# Patient Record
Sex: Female | Born: 1975 | Race: White | Hispanic: No | Marital: Married | Smoking: Never smoker
Health system: Southern US, Community
[De-identification: ages and names within clinical notes are randomized; demographics above are authoritative.]

## PROBLEM LIST (undated history)

## (undated) DIAGNOSIS — L709 Acne, unspecified: Secondary | ICD-10-CM

## (undated) DIAGNOSIS — F329 Major depressive disorder, single episode, unspecified: Secondary | ICD-10-CM

## (undated) DIAGNOSIS — F419 Anxiety disorder, unspecified: Secondary | ICD-10-CM

## (undated) DIAGNOSIS — F32A Depression, unspecified: Secondary | ICD-10-CM

## (undated) HISTORY — DX: Acne, unspecified: L70.9

## (undated) HISTORY — DX: Major depressive disorder, single episode, unspecified: F32.9

## (undated) HISTORY — DX: Depression, unspecified: F32.A

## (undated) HISTORY — DX: Anxiety disorder, unspecified: F41.9

---

## 2007-08-18 ENCOUNTER — Encounter: Payer: Self-pay | Admitting: Family Medicine

## 2007-08-20 ENCOUNTER — Encounter: Payer: Self-pay | Admitting: Family Medicine

## 2007-08-21 ENCOUNTER — Encounter (INDEPENDENT_AMBULATORY_CARE_PROVIDER_SITE_OTHER): Payer: Self-pay | Admitting: *Deleted

## 2007-08-21 LAB — CONVERTED CEMR LAB
ALT: 17 units/L
Alkaline Phosphatase: 53 units/L
BUN: 13 mg/dL
CO2, serum: 27 mmol/L
Cholesterol: 145 mg/dL
HDL: 35 mg/dL
LDL Cholesterol: 98 mg/dL
MCH: 29 pg
Platelets: 232 10*3/uL
RBC count: 4.6 10*6/uL
Total Bilirubin: 0.5 mg/dL
Total Protein: 6.8 g/dL
Triglycerides: 62 mg/dL

## 2008-07-16 ENCOUNTER — Ambulatory Visit: Payer: Self-pay | Admitting: Family Medicine

## 2008-08-26 ENCOUNTER — Encounter (INDEPENDENT_AMBULATORY_CARE_PROVIDER_SITE_OTHER): Payer: Self-pay | Admitting: *Deleted

## 2009-04-22 ENCOUNTER — Ambulatory Visit: Payer: Self-pay | Admitting: Internal Medicine

## 2009-04-22 ENCOUNTER — Ambulatory Visit: Payer: Self-pay | Admitting: Diagnostic Radiology

## 2009-04-22 ENCOUNTER — Ambulatory Visit (HOSPITAL_BASED_OUTPATIENT_CLINIC_OR_DEPARTMENT_OTHER): Admission: RE | Admit: 2009-04-22 | Discharge: 2009-04-22 | Payer: Self-pay | Admitting: Internal Medicine

## 2009-04-22 DIAGNOSIS — R091 Pleurisy: Secondary | ICD-10-CM | POA: Insufficient documentation

## 2009-09-05 ENCOUNTER — Other Ambulatory Visit: Admission: RE | Admit: 2009-09-05 | Discharge: 2009-09-05 | Payer: Self-pay | Admitting: Family Medicine

## 2009-09-05 ENCOUNTER — Ambulatory Visit: Payer: Self-pay | Admitting: Family Medicine

## 2009-09-05 DIAGNOSIS — T07XXXA Unspecified multiple injuries, initial encounter: Secondary | ICD-10-CM | POA: Insufficient documentation

## 2009-09-05 LAB — CONVERTED CEMR LAB
ALT: 13 units/L (ref 0–35)
AST: 17 units/L (ref 0–37)
Albumin: 4.3 g/dL (ref 3.5–5.2)
Alkaline Phosphatase: 48 units/L (ref 39–117)
Bilirubin, Direct: 0.1 mg/dL (ref 0.0–0.3)
Chloride: 103 meq/L (ref 96–112)
Eosinophils Absolute: 0.1 10*3/uL (ref 0.0–0.7)
Eosinophils Relative: 1.4 % (ref 0.0–5.0)
GFR calc non Af Amer: 116.85 mL/min (ref >60)
HCT: 40.2 % (ref 36.0–46.0)
HDL: 44.7 mg/dL (ref >39.00)
Lymphocytes Relative: 30.7 % (ref 12.0–46.0)
MCHC: 34.3 g/dL (ref 30.0–36.0)
Neutro Abs: 3.8 10*3/uL (ref 1.4–7.7)
Platelets: 229 10*3/uL (ref 150.0–400.0)
Potassium: 4.5 meq/L (ref 3.5–5.1)
Prothrombin Time: 11.2 s (ref 9.7–11.8)
RDW: 12.8 % (ref 11.5–14.6)
Sodium: 135 meq/L (ref 135–145)
TSH: 2.44 microintl units/mL (ref 0.35–5.50)
Total Protein: 7.2 g/dL (ref 6.0–8.3)
Triglycerides: 68 mg/dL (ref 0.0–149.0)
WBC: 6.2 10*3/uL (ref 4.5–10.5)

## 2009-09-06 ENCOUNTER — Telehealth (INDEPENDENT_AMBULATORY_CARE_PROVIDER_SITE_OTHER): Payer: Self-pay | Admitting: *Deleted

## 2009-09-06 ENCOUNTER — Encounter: Payer: Self-pay | Admitting: Family Medicine

## 2009-09-08 LAB — CONVERTED CEMR LAB: Pap Smear: NEGATIVE

## 2010-03-14 NOTE — Assessment & Plan Note (Signed)
Summary: CPX/PAP//KN   Vital Signs:  Patient profile:   35 year old female Height:      65.25 inches Weight:      180 pounds BMI:     29.83 Pulse rate:   92 / minute BP sitting:   116 / 80  (left arm)  Vitals Entered By: Doristine Devoid CMA (September 05, 2009 8:35 AM) CC: CPX AND LABS W/ PAP   History of Present Illness: 35 yo woman here today for CPE.    easy bruising- pt reports large bruising w/ minimal trauma.  'i don't even know how or why they get there'.  denies LAD.  no fevers or sweats.  wants derm referral for mole check.  Preventive Screening-Counseling & Management  Alcohol-Tobacco     Alcohol drinks/day: <1     Smoking Status: never  Caffeine-Diet-Exercise     Does Patient Exercise: no      Sexual History:  currently monogamous.        Drug Use:  never.    Problems Prior to Update: 1)  Contusion of Multiple Sites Nec  (ICD-924.8) 2)  Pleurisy  (ICD-511.0) 3)  Physical Examination  (ICD-V70.0)  Current Medications (verified): 1)  Multivitamins  Tabs (Multiple Vitamin) .... 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    Physical Exam  General:  alert, well-developed, and well-nourished.   Head:  Normocephalic and atraumatic without obvious abnormalities. No apparent alopecia or balding. Eyes:  No corneal or conjunctival inflammation noted. EOMI. Perrla. Funduscopic exam benign, without hemorrhages, exudates or papilledema. Vision grossly normal. Ears:  External ear exam shows no significant lesions or deformities.  Otoscopic examination reveals clear canals, tympanic membranes are intact bilaterally without bulging, retraction, inflammation or discharge. Hearing is grossly normal bilaterally. Nose:  External nasal examination shows no deformity or inflammation. Nasal mucosa are pink and moist without lesions or exudates. Mouth:  no exudates and pharyngeal erythema.   Neck:  supple and no masses.   Breasts:  No mass, nodules, thickening, tenderness, bulging, retraction, inflamation, nipple discharge or skin changes noted.   Lungs:  Normal respiratory effort, chest expands symmetrically. Lungs are clear to auscultation, no crackles or wheezes. Heart:  normal rate, regular rhythm, no murmur, no gallop, and no rub.   Abdomen:  Bowel sounds positive,abdomen soft and non-tender without masses, organomegaly or hernias noted. Genitalia:  Pelvic Exam:        External: normal female genitalia without lesions or masses        Vagina: normal without lesions or masses        Cervix: normal without lesions or masses        Adnexa: normal bimanual exam without masses or fullness        Uterus: normal  by palpation        Pap smear: performed Msk:  No deformity or scoliosis noted of thoracic or lumbar spine.   Pulses:  +2 carotid, radial, DP Extremities:  No clubbing, cyanosis, edema, or deformity noted with normal full range of motion of all joints.   Neurologic:  No cranial nerve deficits noted. Station and gait are normal. Plantar reflexes are down-going bilaterally. DTRs are symmetrical throughout.  Sensory, motor and coordinative functions appear intact. Skin:  Intact without suspicious lesions or rashes.  many moles Cervical Nodes:  No lymphadenopathy noted Axillary Nodes:  No palpable lymphadenopathy Psych:  Cognition and judgment appear intact. Alert and cooperative with normal attention span and concentration. No apparent delusions, illusions, hallucinations   Impression & Recommendations:  Problem # 1:  PHYSICAL EXAMINATION (ICD-V70.0) Assessment Unchanged PE WNL.  check labs.  anticipatory guidance provided.  encouraged increased exercise. Orders: Venipuncture (62952) TLB-Lipid Panel (80061-LIPID) TLB-BMP (Basic Metabolic Panel-BMET) (80048-METABOL) TLB-CBC Platelet - w/Differential (85025-CBCD) TLB-Hepatic/Liver Function Pnl (80076-HEPATIC) TLB-TSH (Thyroid Stimulating Hormone) (84443-TSH) T-Vitamin D (25-Hydroxy) (84132-44010)  Problem # 2:  CONTUSION OF MULTIPLE SITES NEC (ICD-924.8) Assessment: New no obvious bruising on exam today.  will check PT/INR.  has never had coagulopathy dx.  will follow. Orders: TLB-PT (Protime) (85610-PTP)  Complete Medication List: 1)  Multivitamins Tabs (Multiple vitamin) .... Take 1 tablet by mouth once a day  Patient Instructions: 1)  Follow up in 1 year or as needed 2)  We'll notify you of your lab results 3)  Try and get regular exercise 4)  If you keep bruising- please call- we can always have you see hematology 5)  We'll call you with your Derm referral 6)  Good luck with school!!  Appended Document: CPX/PAP//KN

## 2010-03-14 NOTE — Assessment & Plan Note (Signed)
Summary: laringytis//lch   Vital Signs:  Patient profile:   35 year old female Weight:      179.50 pounds BMI:     29.75 O2 Sat:      98 % on Room air Temp:     98.4 degrees F oral Pulse rate:   95 / minute Pulse rhythm:   regular Resp:     18 per minute BP sitting:   130 / 78  (right arm) Cuff size:   large  Vitals Entered By: Glendell Docker CMA (April 22, 2009 4:03 PM)  O2 Flow:  Room air CC: Rm 2-  no voice   Primary Care Provider:  Neena Rhymes MD  CC:  Rm 2-  no voice.  History of Present Illness: 35 y/o white female reports recent URI symptoms - onset 3 days she notes hoarseness and mild cough no temp she became concerned when she experienced left sided chest wall pain pain at end of exhalation.   no shortness of breath  2D echo completed in 08/2007.  report reviewed.   normal EF.  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   Ears:  right TM slightly retracted,  L ear normal.   Mouth:  no exudates and pharyngeal erythema.   Neck:  supple and no masses.   Chest Wall:  substernal tenderness,  discomfort leaning forward Lungs:  normal respiratory effort and normal breath sounds.   Heart:  normal rate, regular rhythm, no murmur, no gallop, and no rub.     Impression & Recommendations:  Problem # 1:  PLEURISY (ICD-511.0) 35 y/o white female recent URI and chest wall tenderness. I suspect her symptoms from costochondritis.  check CXR to rule out PTX. Pt advised to take ibuprofen  400-600 mg two times a day x 3-5 days.   increase fluids.  Patient advised to call office if symptoms persist or worsen.  Orders: T-2 View CXR, Same Day (71020.5TC)  Complete Medication List: 1)  Multivitamins Tabs (Multiple vitamin) .... Take 1 tablet by mouth once a day  Patient Instructions: 1)  Take ibuprofen 400-600 mg two times a day with food x 3-5 days as needed. 2)  Increase fluids 6-8 glasses per day 3)  Call our office if your symptoms do not  improve or gets worse.  Current Allergies (reviewed today): No known allergies

## 2010-03-14 NOTE — Progress Notes (Signed)
Summary: Lab Results  Phone Note Outgoing Call   Call placed by: Harold Barban,  September 06, 2009 2:17 PM Summary of Call: level low.  needs 50,000 units weekly x8 weeks and then recheck.  lmtcb,.Marland KitchenMarland KitchenHarold Barban  September 06, 2009 2:17 PM  Follow-up for Phone Call        Patient is aware. Follow-up by: Harold Barban,  September 07, 2009 9:23 AM    New/Updated Medications: VITAMIN D (ERGOCALCIFEROL) 50000 UNIT CAPS (ERGOCALCIFEROL) take one tablet weekly x8 weeks Prescriptions: VITAMIN D (ERGOCALCIFEROL) 50000 UNIT CAPS (ERGOCALCIFEROL) take one tablet weekly x8 weeks  #8 x 0   Entered by:   Doristine Devoid CMA   Authorized by:   Neena Rhymes MD   Signed by:   Doristine Devoid CMA on 09/06/2009   Method used:   Electronically to        [facility] [phone]* (retail)       [address]       [place], [place]  [address]       Ph: [phone]       Fax: [phone]   RxID:   [id number]

## 2010-08-15 ENCOUNTER — Encounter: Payer: Self-pay | Admitting: Family Medicine

## 2010-08-15 ENCOUNTER — Encounter: Payer: Self-pay | Admitting: *Deleted

## 2010-08-17 ENCOUNTER — Ambulatory Visit (INDEPENDENT_AMBULATORY_CARE_PROVIDER_SITE_OTHER): Payer: BC Managed Care – PPO | Admitting: Family Medicine

## 2010-08-17 ENCOUNTER — Encounter: Payer: Self-pay | Admitting: Family Medicine

## 2010-08-17 VITALS — BP 120/80 | Temp 98.7°F | Wt 190.2 lb

## 2010-08-17 DIAGNOSIS — F419 Anxiety disorder, unspecified: Secondary | ICD-10-CM | POA: Insufficient documentation

## 2010-08-17 DIAGNOSIS — F32A Depression, unspecified: Secondary | ICD-10-CM

## 2010-08-17 DIAGNOSIS — F329 Major depressive disorder, single episode, unspecified: Secondary | ICD-10-CM | POA: Insufficient documentation

## 2010-08-17 DIAGNOSIS — F341 Dysthymic disorder: Secondary | ICD-10-CM

## 2010-08-17 HISTORY — DX: Depression, unspecified: F32.A

## 2010-08-17 MED ORDER — CITALOPRAM HYDROBROMIDE 20 MG PO TABS: 20.0000 mg | ORAL_TABLET | Freq: Every day | ORAL | Status: DC

## 2010-08-17 NOTE — Progress Notes (Signed)
  Subjective:    Patient ID: Stacy Ayala, female    DOB: 01-04-1976, 35 y.o.   MRN: [id number]  HPI Anxiety- reports it has been a problem for the last 'couple years'.  Husband told her it was time to see someone.  'i've been trying to control it but i'm mad it got to this point'- pt starts crying.  Having difficulty sleeping- will lie for 'hours and hours replaying her day or worrying about the future'.  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  Husband is concerned about the lack of sleep- plus she's keeping him up.  Pt admits she's unable to enjoy 'the moment' b/c she's 'so focused on what's next'.   Review of Systems For ROS see HPI     Objective:   Physical Exam  Constitutional: She appears well-developed and well-nourished.       Intermittently tearful  Psychiatric:       Very hard on herself.  Tearful.  Unrealistic expectations.          Assessment & Plan:

## 2010-08-17 NOTE — Patient Instructions (Signed)
Follow up in 1 month to recheck mood Start the Celexa daily- if you find it makes you tired, take it at dinner Consider starting counseling to silence your inner critic Hang in there!!!

## 2010-08-17 NOTE — Assessment & Plan Note (Signed)
Entirety of 28 minute visit spent counseling/listening to pt.  Discussed anxiety, early evidence of perfectionist tendencies, the importance of silencing her inner critic, and the importance of enjoying the present.  Will start SSRI to improve both anxiety and sleep.  Names and #s of counselors given for pt to start counseling.  Will follow closely.

## 2010-09-18 ENCOUNTER — Ambulatory Visit: Payer: BC Managed Care – PPO | Admitting: Family Medicine

## 2010-09-21 ENCOUNTER — Encounter: Payer: Self-pay | Admitting: Family Medicine

## 2010-11-08 ENCOUNTER — Encounter: Payer: Self-pay | Admitting: Family Medicine

## 2010-11-23 ENCOUNTER — Encounter: Payer: Self-pay | Admitting: Family Medicine

## 2010-11-23 ENCOUNTER — Ambulatory Visit (INDEPENDENT_AMBULATORY_CARE_PROVIDER_SITE_OTHER): Payer: BC Managed Care – PPO | Admitting: Family Medicine

## 2010-11-23 ENCOUNTER — Other Ambulatory Visit: Payer: Self-pay | Admitting: Family Medicine

## 2010-11-23 ENCOUNTER — Other Ambulatory Visit (HOSPITAL_COMMUNITY)
Admission: RE | Admit: 2010-11-23 | Discharge: 2010-11-23 | Disposition: A | Discharge status: Home / Self Care | Payer: BC Managed Care – PPO | Source: Ambulatory Visit | Attending: Family Medicine | Admitting: Family Medicine

## 2010-11-23 DIAGNOSIS — Z01419 Encounter for gynecological examination (general) (routine) without abnormal findings: Secondary | ICD-10-CM

## 2010-11-23 DIAGNOSIS — N63 Unspecified lump in unspecified breast: Secondary | ICD-10-CM

## 2010-11-23 DIAGNOSIS — Z124 Encounter for screening for malignant neoplasm of cervix: Secondary | ICD-10-CM

## 2010-11-23 DIAGNOSIS — Z0000 Encounter for general adult medical examination without abnormal findings: Secondary | ICD-10-CM | POA: Insufficient documentation

## 2010-11-23 HISTORY — DX: Unspecified lump in unspecified breast: N63.0

## 2010-11-23 HISTORY — DX: Encounter for screening for malignant neoplasm of cervix: Z12.4

## 2010-11-23 HISTORY — DX: Encounter for general adult medical examination without abnormal findings: Z00.00

## 2010-11-23 LAB — HEPATIC FUNCTION PANEL
ALT: 14 U/L (ref 0–35)
AST: 16 U/L (ref 0–37)
Albumin: 4.4 g/dL (ref 3.5–5.2)
Alkaline Phosphatase: 51 U/L (ref 39–117)
Total Bilirubin: 0.4 mg/dL (ref 0.3–1.2)
Total Protein: 7.9 g/dL (ref 6.0–8.3)

## 2010-11-23 LAB — CBC WITH DIFFERENTIAL/PLATELET
Eosinophils Absolute: 0 10*3/uL (ref 0.0–0.7)
Eosinophils Relative: 0.8 % (ref 0.0–5.0)
Lymphs Abs: 1.7 10*3/uL (ref 0.7–4.0)
MCHC: 33.2 g/dL (ref 30.0–36.0)
Monocytes Absolute: 0.3 10*3/uL (ref 0.1–1.0)
Monocytes Relative: 5.8 % (ref 3.0–12.0)
Neutro Abs: 3.5 10*3/uL (ref 1.4–7.7)
RBC: 4.55 Mil/uL (ref 3.87–5.11)
WBC: 5.6 10*3/uL (ref 4.5–10.5)

## 2010-11-23 LAB — BASIC METABOLIC PANEL
BUN: 17 mg/dL (ref 6–23)
GFR: 104.29 mL/min (ref >60.00)
Sodium: 139 mEq/L (ref 135–145)

## 2010-11-23 LAB — LIPID PANEL
Cholesterol: 154 mg/dL (ref 0–200)
LDL Cholesterol: 90 mg/dL (ref 0–99)
Triglycerides: 74 mg/dL (ref 0.0–149.0)
VLDL: 14.8 mg/dL (ref 0.0–40.0)

## 2010-11-23 LAB — TSH: TSH: 1.36 u[IU]/mL (ref 0.35–5.50)

## 2010-11-23 MED ORDER — CITALOPRAM HYDROBROMIDE 20 MG PO TABS: 20.0000 mg | ORAL_TABLET | Freq: Every day | ORAL | Status: DC

## 2010-11-23 NOTE — Assessment & Plan Note (Signed)
Pt's PE WNL w/ exception of L breast mass.  Check labs.  Anticipatory guidance provided.

## 2010-11-23 NOTE — Progress Notes (Signed)
Quick Note:    Labs mailed.  ______

## 2010-11-23 NOTE — Assessment & Plan Note (Signed)
Pap collected. 

## 2010-11-23 NOTE — Assessment & Plan Note (Signed)
Has been present for 1 year.  Not growing or changing,  Not painful.  Will get diagnostic mammo to assess.  Pt expressed understanding and is in agreement w/ plan.

## 2010-11-23 NOTE — Progress Notes (Signed)
  Subjective:    Patient ID: Stacy Ayala, female    DOB: [birth year], 35 y.o.   MRN: [id number]  HPI CPE- no other concerns.  Breast mass- L sided, found 1 year ago.  Had imaging done- [place] and xray at [facility] w/out results.  Lump remains, has not grown or changed, not painful.  Located directly behind nipple.  No nipple D/C.   Review of Systems Patient reports no vision/ hearing  changes, adenopathy,fever, weight change,  persistant / recurrent hoarseness , swallowing issues, chest pain,palpitations,edema,persistant /recurrent cough, hemoptysis, dyspnea( rest/ exertional/paroxysmal nocturnal), gastrointestinal bleeding(melena, rectal bleeding), abdominal pain, significant heartburn bowel changes,GU symptoms(dysuria, hematuria,pyuria, incontinence) ), Gyn symptoms(abnormal  bleeding , pain),  syncope, focal weakness, memory loss,numbness & tingling, skin/hair /nail changes,abnormal bruising or bleeding, anxiety,or depression.     Objective:   Physical Exam  General Appearance:    Alert, cooperative, no distress, appears stated age  Head:    Normocephalic, without obvious abnormality, atraumatic  Eyes:    PERRL, conjunctiva/corneas clear, EOM's intact, fundi    benign, both eyes  Ears:    Normal TM's and external ear canals, both ears  Nose:   Nares normal, septum midline, mucosa normal, no drainage    or sinus tenderness  Throat:   Lips, mucosa, and tongue normal; teeth and gums normal  Neck:   Supple, symmetrical, trachea midline, no adenopathy;    Thyroid: no enlargement/tenderness/nodules  Back:     Symmetric, no curvature, ROM normal, no CVA tenderness  Lungs:     Clear to auscultation bilaterally, respirations unlabored  Chest Wall:    No tenderness or deformity   Heart:    Regular rate and rhythm, S1 and S2 normal, no murmur, rub   or gallop  Breast Exam:    No tenderness or nipple abnormality, L breast w/ 1 cm mass just inferior to nipple at 7 o'clock.  No skin changes or nipple D/C.    Abdomen:     Soft, non-tender, bowel sounds active all four quadrants,    no masses, no organomegaly  Genitalia:    External genitalia normal, cervix normal in appearance, no CMT, uterus in normal size and position, adnexa w/out mass or tenderness, mucosa pink and moist, no lesions or discharge present  Rectal:    Normal external appearance  Extremities:   Extremities normal, atraumatic, no cyanosis or edema  Pulses:   2+ and symmetric all extremities  Skin:   Skin color, texture, turgor normal, no rashes or lesions  Lymph nodes:   Cervical, supraclavicular, and axillary nodes normal  Neurologic:   CNII-XII intact, normal strength, sensation and reflexes    throughout          Assessment & Plan:

## 2010-11-23 NOTE — Patient Instructions (Signed)
Follow up in 1 year or as needed You look great!  Keep up the good work! They will call you with [facility]t [facility] We'll notify you of your lab results Call with any questions or concerns Have a great holiday season!

## 2010-11-24 ENCOUNTER — Encounter: Payer: Self-pay | Admitting: *Deleted

## 2010-11-25 LAB — VITAMIN D 1,25 DIHYDROXY: Vitamin D3 1, 25 (OH)2: 57 pg/mL

## 2010-11-27 NOTE — Progress Notes (Signed)
Quick Note:    Labs mailed.  ______

## 2010-12-07 ENCOUNTER — Ambulatory Visit
Admission: RE | Admit: 2010-12-07 | Discharge: 2010-12-07 | Disposition: A | Discharge status: Home / Self Care | Payer: BC Managed Care – PPO | Source: Ambulatory Visit | Attending: Family Medicine | Admitting: Family Medicine

## 2010-12-07 DIAGNOSIS — N63 Unspecified lump in unspecified breast: Secondary | ICD-10-CM

## 2011-11-14 ENCOUNTER — Encounter: Payer: Self-pay | Admitting: Family Medicine

## 2011-11-14 ENCOUNTER — Ambulatory Visit (INDEPENDENT_AMBULATORY_CARE_PROVIDER_SITE_OTHER): Payer: BC Managed Care – PPO | Admitting: Family Medicine

## 2011-11-14 VITALS — BP 127/82 | HR 100 | Temp 98.5°F | Ht 65.0 in | Wt 214.6 lb

## 2011-11-14 DIAGNOSIS — L0292 Furuncle, unspecified: Secondary | ICD-10-CM

## 2011-11-14 DIAGNOSIS — L0293 Carbuncle, unspecified: Secondary | ICD-10-CM

## 2011-11-14 MED ORDER — CEPHALEXIN 500 MG PO CAPS: 500.0000 mg | ORAL_CAPSULE | Freq: Two times a day (BID) | ORAL | Status: AC

## 2011-11-14 NOTE — Assessment & Plan Note (Signed)
New.  Area consistent w/ infected ingrown hair.  Start keflex, heat/ice.  Provided reassurance that this is not a blood clot.  Reviewed supportive care and red flags that should prompt return.  Pt expressed understanding and is in agreement w/ plan.

## 2011-11-14 NOTE — Progress Notes (Signed)
  Subjective:    Patient ID: Stacy Ayala, female    DOB: 1975/03/14, 36 y.o.   MRN: [id number]  HPI Lump in groin- Sunday noticed 'protruding thing coming out from my groin', L sided.  Started picking at the area last night.  'it got really gross and big'.  Iced the area.  Husband got 'freaked out' thinking it was a blood clot.  Area is uncomfortable.  Size is variable.  + surrounding redness/purple color.  No drainage.   Review of Systems For ROS see HPI     Objective:   Physical Exam  Vitals reviewed. Constitutional: She appears well-developed and well-nourished. No distress.  Skin: Skin is warm and dry. There is erythema (1 cm soft, erythematous/violacious area of induration w/ central inflammed hair follicle).          Assessment & Plan:

## 2011-11-14 NOTE — Patient Instructions (Addendum)
This is an infected ingrown hair Start the keflex twice daily Warm compresses (or ice) Try not to pick!!! Call with any questions or concerns Hang in there!!!

## 2011-11-15 ENCOUNTER — Ambulatory Visit: Payer: BC Managed Care – PPO | Admitting: Family Medicine

## 2011-12-03 ENCOUNTER — Telehealth: Payer: Self-pay | Admitting: Family Medicine

## 2011-12-03 MED ORDER — CITALOPRAM HYDROBROMIDE 20 MG PO TABS: 20.0000 mg | ORAL_TABLET | Freq: Every day | ORAL | Status: DC

## 2011-12-03 NOTE — Telephone Encounter (Signed)
Refill: Celexa 20mg  tab. Take one tablet by mouth every day. Qty 30. Last fill 11-03-11

## 2011-12-03 NOTE — Telephone Encounter (Signed)
OV 11/14/11 labs 11/23/10 Refill: Celexa 20mg  tab. Take one tablet by mouth every day. Qty 30. Last fill 11-03-11  Does pt need labs before refill. Plz advise    MW

## 2011-12-03 NOTE — Telephone Encounter (Signed)
rx sent to pharmacy by e-script Letter has been mailed to pt address noted in the chart to advise they are overdue for cpe/ov/labs and the pt needs to contact office to set up appt  Pt needs CPE with fasting labs 

## 2011-12-03 NOTE — Telephone Encounter (Signed)
Ok for #30, 6 refills.  Please remind her to schedule CPE

## 2011-12-07 ENCOUNTER — Telehealth: Payer: Self-pay | Admitting: *Deleted

## 2011-12-07 MED ORDER — CITALOPRAM HYDROBROMIDE 20 MG PO TABS: 20.0000 mg | ORAL_TABLET | Freq: Every day | ORAL | Status: DC

## 2011-12-07 NOTE — Telephone Encounter (Signed)
Noted incoming fax to advise that the pt has changed to the new [facility] on [address], sent Rx there

## 2012-02-27 ENCOUNTER — Ambulatory Visit (INDEPENDENT_AMBULATORY_CARE_PROVIDER_SITE_OTHER): Payer: BC Managed Care – PPO | Admitting: Family Medicine

## 2012-02-27 ENCOUNTER — Encounter: Payer: Self-pay | Admitting: Family Medicine

## 2012-02-27 ENCOUNTER — Other Ambulatory Visit (HOSPITAL_COMMUNITY)
Admission: RE | Admit: 2012-02-27 | Discharge: 2012-02-27 | Disposition: A | Discharge status: Home / Self Care | Payer: BC Managed Care – PPO | Source: Ambulatory Visit | Attending: Family Medicine | Admitting: Family Medicine

## 2012-02-27 VITALS — BP 128/88 | HR 84 | Temp 98.1°F | Ht 64.5 in | Wt 218.8 lb

## 2012-02-27 DIAGNOSIS — Z124 Encounter for screening for malignant neoplasm of cervix: Secondary | ICD-10-CM

## 2012-02-27 DIAGNOSIS — N760 Acute vaginitis: Secondary | ICD-10-CM | POA: Insufficient documentation

## 2012-02-27 DIAGNOSIS — Z01419 Encounter for gynecological examination (general) (routine) without abnormal findings: Secondary | ICD-10-CM

## 2012-02-27 DIAGNOSIS — Z23 Encounter for immunization: Secondary | ICD-10-CM

## 2012-02-27 LAB — CBC WITH DIFFERENTIAL/PLATELET
Eosinophils Absolute: 0 10*3/uL (ref 0.0–0.7)
Hemoglobin: 13.2 g/dL (ref 12.0–15.0)
Lymphocytes Relative: 27.7 % (ref 12.0–46.0)
Lymphs Abs: 1.9 10*3/uL (ref 0.7–4.0)
MCHC: 33.5 g/dL (ref 30.0–36.0)
Monocytes Absolute: 0.4 10*3/uL (ref 0.1–1.0)
Monocytes Relative: 6.2 % (ref 3.0–12.0)
Neutro Abs: 4.5 10*3/uL (ref 1.4–7.7)
RDW: 12.8 % (ref 11.5–14.6)
WBC: 6.9 10*3/uL (ref 4.5–10.5)

## 2012-02-27 LAB — LIPID PANEL
HDL: 37 mg/dL — ABNORMAL LOW (ref >39.00)
Total CHOL/HDL Ratio: 4
Triglycerides: 82 mg/dL (ref 0.0–149.0)

## 2012-02-27 LAB — BASIC METABOLIC PANEL
BUN: 13 mg/dL (ref 6–23)
Calcium: 8.8 mg/dL (ref 8.4–10.5)
Creatinine, Ser: 0.7 mg/dL (ref 0.4–1.2)
Potassium: 3.8 mEq/L (ref 3.5–5.1)

## 2012-02-27 LAB — HEPATIC FUNCTION PANEL
Alkaline Phosphatase: 60 U/L (ref 39–117)
Bilirubin, Direct: 0 mg/dL (ref 0.0–0.3)
Total Protein: 7.5 g/dL (ref 6.0–8.3)

## 2012-02-27 LAB — TSH: TSH: 1.92 u[IU]/mL (ref 0.35–5.50)

## 2012-02-27 NOTE — Assessment & Plan Note (Signed)
Pt's PE WNL.  Check labs.  Check for yeast on pap due to vaginal d/c.  Anticipatory guidance provided.

## 2012-02-27 NOTE — Patient Instructions (Addendum)
Follow up in 1 year or as needed We'll notify you of your lab results and make any changes if needed Keep up the good work! Call with any questions or concerns Happy New Year! 

## 2012-02-27 NOTE — Progress Notes (Signed)
  Subjective:    Patient ID: Stacy Ayala, female    DOB: 10-31-1975, 37 y.o.   MRN: [id number]  HPI CPE- no concerns today.   Review of Systems Patient reports no vision/ hearing changes, adenopathy,fever, weight change,  persistant/recurrent hoarseness , swallowing issues, chest pain, palpitations, edema, persistant/recurrent cough, hemoptysis, dyspnea (rest/exertional/paroxysmal nocturnal), gastrointestinal bleeding (melena, rectal bleeding), abdominal pain, significant heartburn, bowel changes, GU symptoms (dysuria, hematuria, incontinence), Gyn symptoms (abnormal  bleeding, pain),  syncope, focal weakness, memory loss, numbness & tingling, skin/hair/nail changes, abnormal bruising or bleeding, anxiety, or depression.     Objective:   Physical Exam  General Appearance:    Alert, cooperative, no distress, appears stated age  Head:    Normocephalic, without obvious abnormality, atraumatic  Eyes:    PERRL, conjunctiva/corneas clear, EOM's intact, fundi    benign, both eyes  Ears:    Normal TM's and external ear canals, both ears  Nose:   Nares normal, septum midline, mucosa normal, no drainage    or sinus tenderness  Throat:   Lips, mucosa, and tongue normal; teeth and gums normal  Neck:   Supple, symmetrical, trachea midline, no adenopathy;    Thyroid: no enlargement/tenderness/nodules  Back:     Symmetric, no curvature, ROM normal, no CVA tenderness  Lungs:     Clear to auscultation bilaterally, respirations unlabored  Chest Wall:    No tenderness or deformity   Heart:    Regular rate and rhythm, S1 and S2 normal, no murmur, rub   or gallop  Breast Exam:    No tenderness, masses, or nipple abnormality  Abdomen:     Soft, non-tender, bowel sounds active all four quadrants,    no masses, no organomegaly  Genitalia:    External genitalia normal, cervix normal in appearance, no CMT, uterus in normal size and position, adnexa w/out mass or tenderness, mucosa pink and moist, no lesions, +  vaginal d/c consistent w/ yeast  Rectal:    Normal external appearance  Extremities:   Extremities normal, atraumatic, no cyanosis or edema  Pulses:   2+ and symmetric all extremities  Skin:   Skin color, texture, turgor normal, no rashes or lesions  Lymph nodes:   Cervical, supraclavicular, and axillary nodes normal  Neurologic:   CNII-XII intact, normal strength, sensation and reflexes    throughout          Assessment & Plan:

## 2012-02-27 NOTE — Assessment & Plan Note (Signed)
Pap collected. 

## 2012-02-28 ENCOUNTER — Encounter: Payer: Self-pay | Admitting: *Deleted

## 2012-03-03 ENCOUNTER — Encounter: Payer: Self-pay | Admitting: *Deleted

## 2012-03-04 LAB — VITAMIN D 1,25 DIHYDROXY
Vitamin D 1, 25 (OH)2 Total: 74 pg/mL — ABNORMAL HIGH (ref 18–72)
Vitamin D2 1, 25 (OH)2: <8 pg/mL
Vitamin D3 1, 25 (OH)2: 74 pg/mL

## 2012-07-25 ENCOUNTER — Encounter: Payer: Self-pay | Admitting: Family Medicine

## 2012-07-25 ENCOUNTER — Ambulatory Visit (INDEPENDENT_AMBULATORY_CARE_PROVIDER_SITE_OTHER): Payer: BC Managed Care – PPO | Admitting: Family Medicine

## 2012-07-25 VITALS — BP 120/76 | HR 94 | Temp 98.2°F | Wt 231.6 lb

## 2012-07-25 DIAGNOSIS — L247 Irritant contact dermatitis due to plants, except food: Secondary | ICD-10-CM

## 2012-07-25 DIAGNOSIS — L255 Unspecified contact dermatitis due to plants, except food: Secondary | ICD-10-CM

## 2012-07-25 MED ORDER — MOMETASONE FUROATE 0.1 % EX CREA: TOPICAL_CREAM | Freq: Every day | CUTANEOUS | Status: DC

## 2012-07-25 MED ORDER — PREDNISONE 10 MG PO TABS: ORAL_TABLET | ORAL | Status: DC

## 2012-07-25 NOTE — Patient Instructions (Signed)
Poison Ivy Poison ivy is a inflammation of the skin (contact dermatitis) caused by touching the allergens on the leaves of the ivy plant following previous exposure to the plant. The rash usually appears 48 hours after exposure. The rash is usually bumps (papules) or blisters (vesicles) in a linear pattern. Depending on your own sensitivity, the rash may simply cause redness and itching, or it may also progress to blisters which may break open. These must be well cared for to prevent secondary bacterial (germ) infection, followed by scarring. Keep any open areas dry, clean, dressed, and covered with an antibacterial ointment if needed. The eyes may also get puffy. The puffiness is worst in the morning and gets better as the day progresses. This dermatitis usually heals without scarring, within 2 to 3 weeks without treatment. HOME CARE INSTRUCTIONS  Thoroughly wash with soap and water as soon as you have been exposed to poison ivy. You have about one half hour to remove the plant resin before it will cause the rash. This washing will destroy the oil or antigen on the skin that is causing, or will cause, the rash. Be sure to wash under your fingernails as any plant resin there will continue to spread the rash. Do not rub skin vigorously when washing affected area. Poison ivy cannot spread if no oil from the plant remains on your body. A rash that has progressed to weeping sores will not spread the rash unless you have not washed thoroughly. It is also important to wash any clothes you have been wearing as these may carry active allergens. The rash will return if you wear the unwashed clothing, even several days later. Avoidance of the plant in the future is the best measure. Poison ivy plant can be recognized by the number of leaves. Generally, poison ivy has three leaves with flowering branches on a single stem. Diphenhydramine may be purchased over the counter and used as needed for itching. Do not drive with  this medication if it makes you drowsy.Ask your caregiver about medication for children. SEEK MEDICAL CARE IF:  Open sores develop.  Redness spreads beyond area of rash.  You notice purulent (pus-like) discharge.  You have increased pain.  Other signs of infection develop (such as fever). Document Released: 01/27/2000 Document Revised: 04/23/2011 Document Reviewed: 12/15/2008 Exit[employer] Patient Information 2014 [employer].  

## 2012-07-25 NOTE — Progress Notes (Signed)
  Subjective:     Stacy Ayala is a 37 y.o. female who presents for evaluation of a rash involving the lower leg. Rash started 7 days ago. Lesions are pink, and raised in texture. Rash has changed over time. Rash is pruritic. Associated symptoms: none. Patient denies: abdominal pain, arthralgia, congestion, cough, crankiness, decrease in appetite, decrease in energy level, fever, headache, irritability, myalgia, nausea, sore throat and vomiting. Patient has not had contacts with similar rash. Patient has had new exposures (soaps, lotions, laundry detergents, foods, medications, plants, insects or animals). Pt was camping  [employer] last week and black dot showed up first then reddness around it.  It is very itch.     The following portions of the patient's history were reviewed and updated as appropriate: allergies, current medications, past family history, past medical history, past social history, past surgical history and problem list.  Review of Systems Pertinent items are noted in HPI.    Objective:    BP 120/76  Pulse 94  Temp(Src) 98.2 F (36.8 C) (Oral)  Wt 231 lb 9.6 oz (105.053 kg)  BMI 39.15 kg/m2  SpO2 95% General:  alert, cooperative, appears stated age and no distress  Skin:  normal and + black scab with surrounding errythema R Low ext     Assessment:    contact dermatitis: plants poison    Plan:    Medications: topical steroid: see meds . Written patient instruction given. Follow up in a few days. if no better

## 2012-10-23 ENCOUNTER — Telehealth: Payer: Self-pay | Admitting: General Practice

## 2012-10-23 NOTE — Telephone Encounter (Signed)
Pt called stating that 2 years ago Beverely Low had given her a list of therapists for anxiety. She does not need a referral but would just like your opinion for a good therapist.

## 2012-10-23 NOTE — Telephone Encounter (Signed)
Rodman Comp or Judithe Modest w/ 

## 2012-10-24 NOTE — Telephone Encounter (Signed)
Called pt and LMOVM

## 2012-10-27 NOTE — Telephone Encounter (Signed)
Pt.notified

## 2012-11-13 ENCOUNTER — Ambulatory Visit (INDEPENDENT_AMBULATORY_CARE_PROVIDER_SITE_OTHER): Payer: BC Managed Care – PPO | Admitting: Licensed Clinical Social Worker

## 2012-11-13 DIAGNOSIS — F331 Major depressive disorder, recurrent, moderate: Secondary | ICD-10-CM

## 2012-12-02 ENCOUNTER — Ambulatory Visit (INDEPENDENT_AMBULATORY_CARE_PROVIDER_SITE_OTHER): Payer: BC Managed Care – PPO | Admitting: Licensed Clinical Social Worker

## 2012-12-02 DIAGNOSIS — F331 Major depressive disorder, recurrent, moderate: Secondary | ICD-10-CM

## 2012-12-08 ENCOUNTER — Encounter: Payer: Self-pay | Admitting: Family Medicine

## 2012-12-08 ENCOUNTER — Ambulatory Visit (INDEPENDENT_AMBULATORY_CARE_PROVIDER_SITE_OTHER): Payer: BC Managed Care – PPO | Admitting: Family Medicine

## 2012-12-08 VITALS — BP 122/76 | HR 103 | Temp 98.3°F | Resp 16 | Wt 234.0 lb

## 2012-12-08 DIAGNOSIS — F341 Dysthymic disorder: Secondary | ICD-10-CM

## 2012-12-08 DIAGNOSIS — F329 Major depressive disorder, single episode, unspecified: Secondary | ICD-10-CM

## 2012-12-08 MED ORDER — BUPROPION HCL ER (XL) 150 MG PO TB24: 150.0000 mg | ORAL_TABLET | Freq: Every day | ORAL | Status: DC

## 2012-12-08 NOTE — Progress Notes (Signed)
  Subjective:    Patient ID: Stacy Ayala, female    DOB: 1975/02/15, 37 y.o.   MRN: [id number]  HPI Anxiety/depression- weaned off meds over the summer.  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  Liked the way she felt on Celexa but has gained 40 lbs and family feels that pt was emotionally blunted.  Currently in counseling and counselor felt it would be good idea to restart some type of medication.  Difficulty sleeping.  Increased eating.  Frequently tearful- 'i cry at the drop of a hat'.   Review of Systems For ROS see HPI     Objective:   Physical Exam  Vitals reviewed. Constitutional: She is oriented to person, place, and time. She appears well-developed and well-nourished.  Overweight, tearful  Neurological: She is alert and oriented to person, place, and time.  Skin: Skin is warm and dry.  Psychiatric: Her behavior is normal. Judgment and thought content normal.  Anxious, tearful          Assessment & Plan:

## 2012-12-08 NOTE — Assessment & Plan Note (Signed)
Deteriorated.  Pt was too emotionally blunted on Celexa and gained considerable amount of weight.  Will start Wellbutrin to better control sxs and work on weight loss.  Encouraged pt to continue counseling.  Will follow closely.  Pt expressed understanding and is in agreement w/ plan.

## 2012-12-08 NOTE — Patient Instructions (Signed)
Follow up in 4-6 weeks to recheck mood Start the Wellbutrin daily in the AM Continue therapy- this is hard work, but worth it in the end! Call if you have any side effects that last longer than 7-10 days Call with any questions or concerns Hang in there!!!

## 2012-12-16 ENCOUNTER — Ambulatory Visit (INDEPENDENT_AMBULATORY_CARE_PROVIDER_SITE_OTHER): Payer: BC Managed Care – PPO | Admitting: Licensed Clinical Social Worker

## 2012-12-16 DIAGNOSIS — F331 Major depressive disorder, recurrent, moderate: Secondary | ICD-10-CM

## 2013-01-01 ENCOUNTER — Ambulatory Visit: Payer: BC Managed Care – PPO | Admitting: Licensed Clinical Social Worker

## 2013-04-13 ENCOUNTER — Telehealth: Payer: Self-pay

## 2013-04-13 ENCOUNTER — Encounter: Payer: Self-pay | Admitting: Family Medicine

## 2013-04-13 ENCOUNTER — Ambulatory Visit (INDEPENDENT_AMBULATORY_CARE_PROVIDER_SITE_OTHER): Payer: BC Managed Care – PPO | Admitting: Family Medicine

## 2013-04-13 VITALS — BP 122/80 | HR 107 | Temp 98.2°F | Resp 16 | Ht 65.0 in | Wt 211.5 lb

## 2013-04-13 DIAGNOSIS — F329 Major depressive disorder, single episode, unspecified: Secondary | ICD-10-CM

## 2013-04-13 DIAGNOSIS — Z01419 Encounter for gynecological examination (general) (routine) without abnormal findings: Secondary | ICD-10-CM

## 2013-04-13 DIAGNOSIS — F419 Anxiety disorder, unspecified: Secondary | ICD-10-CM

## 2013-04-13 DIAGNOSIS — F341 Dysthymic disorder: Secondary | ICD-10-CM

## 2013-04-13 DIAGNOSIS — F32A Depression, unspecified: Secondary | ICD-10-CM

## 2013-04-13 LAB — CBC WITH DIFFERENTIAL/PLATELET
BASOS PCT: 0.3 % (ref 0.0–3.0)
Basophils Absolute: 0 10*3/uL (ref 0.0–0.1)
EOS ABS: 0.1 10*3/uL (ref 0.0–0.7)
EOS PCT: 1.3 % (ref 0.0–5.0)
HCT: 39.1 % (ref 36.0–46.0)
Hemoglobin: 12.9 g/dL (ref 12.0–15.0)
Lymphocytes Relative: 21.1 % (ref 12.0–46.0)
Lymphs Abs: 1.5 10*3/uL (ref 0.7–4.0)
MCHC: 32.9 g/dL (ref 30.0–36.0)
MCV: 87.3 fl (ref 78.0–100.0)
MONO ABS: 0.4 10*3/uL (ref 0.1–1.0)
MONOS PCT: 5.2 % (ref 3.0–12.0)
NEUTROS ABS: 5.2 10*3/uL (ref 1.4–7.7)
NEUTROS PCT: 72.1 % (ref 43.0–77.0)
Platelets: 250 10*3/uL (ref 150.0–400.0)
RBC: 4.48 Mil/uL (ref 3.87–5.11)
RDW: 13.3 % (ref 11.5–14.6)
WBC: 7.2 10*3/uL (ref 4.5–10.5)

## 2013-04-13 LAB — HEPATIC FUNCTION PANEL
ALK PHOS: 53 U/L (ref 39–117)
ALT: 17 U/L (ref 0–35)
AST: 17 U/L (ref 0–37)
Albumin: 4.1 g/dL (ref 3.5–5.2)
BILIRUBIN DIRECT: 0 mg/dL (ref 0.0–0.3)
BILIRUBIN TOTAL: 0.4 mg/dL (ref 0.3–1.2)
TOTAL PROTEIN: 7.4 g/dL (ref 6.0–8.3)

## 2013-04-13 LAB — BASIC METABOLIC PANEL
BUN: 12 mg/dL (ref 6–23)
CALCIUM: 8.8 mg/dL (ref 8.4–10.5)
CO2: 22 mEq/L (ref 19–32)
CREATININE: 0.7 mg/dL (ref 0.4–1.2)
Chloride: 105 mEq/L (ref 96–112)
GFR: 93.36 mL/min (ref >60.00)
GLUCOSE: 87 mg/dL (ref 70–99)
Potassium: 3.8 mEq/L (ref 3.5–5.1)
Sodium: 135 mEq/L (ref 135–145)

## 2013-04-13 LAB — LIPID PANEL
CHOLESTEROL: 154 mg/dL (ref 0–200)
HDL: 38.4 mg/dL — AB (ref >39.00)
LDL CALC: 90 mg/dL (ref 0–99)
TRIGLYCERIDES: 129 mg/dL (ref 0.0–149.0)
Total CHOL/HDL Ratio: 4
VLDL: 25.8 mg/dL (ref 0.0–40.0)

## 2013-04-13 LAB — TSH: TSH: 5.77 u[IU]/mL — AB (ref 0.35–5.50)

## 2013-04-13 MED ORDER — BUPROPION HCL ER (XL) 300 MG PO TB24: 300.0000 mg | ORAL_TABLET | Freq: Every day | ORAL | Status: DC

## 2013-04-13 NOTE — Assessment & Plan Note (Signed)
Deteriorated.  Increase Wellbutrin to 300mg  daily and monitor for improvement.

## 2013-04-13 NOTE — Assessment & Plan Note (Signed)
Pt's PE WNL w/ exception of fine maculopapular drug rash on abd and breasts.  Pt to stop Bactrim and call derm.  Check labs.  Anticipatory guidance provided.

## 2013-04-13 NOTE — Progress Notes (Signed)
   Subjective:    Patient ID: Stacy Ayala, female    DOB: 03/14/1975, 38 y.o.   MRN: [id number]  HPI CPE- UTD on pap.  Depression- pt reports Wellbutrin is working much better than previous meds but in the last 4-6 weeks has been less effective.   Review of Systems Patient reports no vision/ hearing changes, adenopathy,fever, weight change,  persistant/recurrent hoarseness , swallowing issues, chest pain, palpitations, edema, persistant/recurrent cough, hemoptysis, dyspnea (rest/exertional/paroxysmal nocturnal), gastrointestinal bleeding (melena, rectal bleeding), abdominal pain, significant heartburn, bowel changes, GU symptoms (dysuria, hematuria, incontinence), Gyn symptoms (abnormal  bleeding, pain),  syncope, focal weakness, memory loss, numbness & tingling, skin/hair/nail changes, abnormal bruising or bleeding, anxiety, or depression.     Objective:   Physical Exam  General Appearance:    Alert, cooperative, no distress, appears stated age  Head:    Normocephalic, without obvious abnormality, atraumatic  Eyes:    PERRL, conjunctiva/corneas clear, EOM's intact, fundi    benign, both eyes  Ears:    Normal TM's and external ear canals, both ears  Nose:   Nares normal, septum midline, mucosa normal, no drainage    or sinus tenderness  Throat:   Lips, mucosa, and tongue normal; teeth and gums normal  Neck:   Supple, symmetrical, trachea midline, no adenopathy;    Thyroid: no enlargement/tenderness/nodules  Back:     Symmetric, no curvature, ROM normal, no CVA tenderness  Lungs:     Clear to auscultation bilaterally, respirations unlabored  Chest Wall:    No tenderness or deformity   Heart:    Regular rate and rhythm, S1 and S2 normal, no murmur, rub   or gallop  Breast Exam:    No tenderness, masses, or nipple abnormality  Abdomen:     Soft, non-tender, bowel sounds active all four quadrants,    no masses, no organomegaly  Genitalia:    deferred  Rectal:    Extremities:    Extremities normal, atraumatic, no cyanosis or edema  Pulses:   2+ and symmetric all extremities  Skin:   Skin color, texture, turgor normal, fine maculopapular rash on abd and breasts  Lymph nodes:   Cervical, supraclavicular, and axillary nodes normal  Neurologic:   CNII-XII intact, normal strength, sensation and reflexes    throughout          Assessment & Plan:

## 2013-04-13 NOTE — Patient Instructions (Signed)
Follow up in 1 year or as needed We'll notify you of your lab results and make any changes if needed Increase the Wellbutrin to 300mg  daily (2 of what you have at home and 1 of the new prescription) STOP the acne med and call the dermatologist Call with any questions or concerns Happy Birthday!!!

## 2013-04-13 NOTE — Progress Notes (Signed)
Pre visit review using our clinic review tool, if applicable. No additional management support is needed unless otherwise documented below in the visit note. 

## 2013-04-13 NOTE — Telephone Encounter (Signed)
Medication and allergies:  Reviewed and updated  90 day supply/mail order: n/a Local pharmacy:  [facility] - [place], [place] - [address]   Immunizations due:  Influenza-declined, Tdap-DUE   A/P: No changes to personal, family history or past surgical hx PAP- 02/27/12-negative Flu- declined Tdap- Received last shot approximately 11 years ago per patient.     To Discuss with Provider: Hormone testing. Acne uncontrollable since this past summer. Would like to discuss Wellbutrin.  Medication seems to be not as effective as when initially started.

## 2013-04-14 ENCOUNTER — Telehealth: Payer: Self-pay

## 2013-04-14 ENCOUNTER — Ambulatory Visit: Payer: BC Managed Care – PPO

## 2013-04-14 DIAGNOSIS — R946 Abnormal results of thyroid function studies: Secondary | ICD-10-CM

## 2013-04-14 LAB — T3, FREE: T3 FREE: 2.6 pg/mL (ref 2.3–4.2)

## 2013-04-14 LAB — T4, FREE: FREE T4: 0.69 ng/dL (ref 0.60–1.60)

## 2013-04-14 NOTE — Telephone Encounter (Signed)
Patient called to inquire about lab results.  The following was shared with patient:  Per Dr. Gilberto Better T4 (dx abnormal TSH) has been added to lab work. Remainder of labs look good but will likely need to start thyroid meds.  Patient stated understanding.  No further concerns were expressed at this time.

## 2013-04-15 ENCOUNTER — Other Ambulatory Visit: Payer: Self-pay | Admitting: General Practice

## 2013-04-15 DIAGNOSIS — E039 Hypothyroidism, unspecified: Secondary | ICD-10-CM

## 2013-04-15 MED ORDER — LEVOTHYROXINE SODIUM 75 MCG PO TABS: 75.0000 ug | ORAL_TABLET | Freq: Every day | ORAL | Status: DC

## 2013-04-18 LAB — VITAMIN D 1,25 DIHYDROXY
VITAMIN D 1, 25 (OH) TOTAL: 84 pg/mL — AB (ref 18–72)
VITAMIN D3 1, 25 (OH): 84 pg/mL
Vitamin D2 1, 25 (OH)2: <8 pg/mL

## 2013-04-20 ENCOUNTER — Encounter: Payer: Self-pay | Admitting: General Practice

## 2013-07-15 ENCOUNTER — Other Ambulatory Visit (INDEPENDENT_AMBULATORY_CARE_PROVIDER_SITE_OTHER): Payer: BC Managed Care – PPO

## 2013-07-15 DIAGNOSIS — E039 Hypothyroidism, unspecified: Secondary | ICD-10-CM

## 2013-07-16 ENCOUNTER — Other Ambulatory Visit: Payer: Self-pay | Admitting: General Practice

## 2013-07-16 LAB — TSH: TSH: 0.18 u[IU]/mL — ABNORMAL LOW (ref 0.35–4.50)

## 2013-07-16 MED ORDER — LEVOTHYROXINE SODIUM 50 MCG PO TABS: 50.0000 ug | ORAL_TABLET | Freq: Every day | ORAL | Status: DC

## 2013-09-22 ENCOUNTER — Ambulatory Visit (INDEPENDENT_AMBULATORY_CARE_PROVIDER_SITE_OTHER): Payer: BC Managed Care – PPO | Admitting: Medical

## 2013-09-22 VITALS — BP 130/87 | HR 93 | Temp 98.2°F | Wt 208.0 lb

## 2013-09-22 DIAGNOSIS — F32A Depression, unspecified: Secondary | ICD-10-CM

## 2013-09-22 DIAGNOSIS — F341 Dysthymic disorder: Secondary | ICD-10-CM

## 2013-09-22 DIAGNOSIS — F419 Anxiety disorder, unspecified: Principal | ICD-10-CM

## 2013-09-22 DIAGNOSIS — F329 Major depressive disorder, single episode, unspecified: Secondary | ICD-10-CM

## 2013-09-22 MED ORDER — CLONAZEPAM 0.5 MG PO TABS: 0.5000 mg | ORAL_TABLET | Freq: Two times a day (BID) | ORAL | Status: DC | PRN

## 2013-09-22 NOTE — Assessment & Plan Note (Signed)
Will rx clonzepam today for anxiety and insomnia. Counseled on use. In near future may consider very low dose sertraline for anxiety  with bupropion(for depression) but since she gained a lot of weight with citalopram(ssri) decided against that presently.

## 2013-09-22 NOTE — Progress Notes (Signed)
   Subjective:    Patient ID: Stacy Ayala, female    DOB: Jul 28, 1975, 38 y.o.   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 Pt has history of depression and she is on bupropion for about a year and she is doing well. Pt however just recently as school/work is approaching has anxiety and insomnia. Pt is taking otc simply sleep or  tylenol pm. Pt states in past usually Sunday night prior to work usually is most anxious time. Pt used to be on citalopram but gained a lot weight and was switched to buproprion.  She lost 30 lbs after stopping citalopram.  LMP- started on Saturday.    Review of Systems  Constitutional: Negative for fever and chills.  HENT: Negative.   Respiratory: Negative for choking, chest tightness and wheezing.   Gastrointestinal: Negative.   Genitourinary: Negative.   Musculoskeletal: Negative.   Neurological: Negative for dizziness, syncope, facial asymmetry, speech difficulty, weakness, light-headedness, numbness and headaches.  Hematological:       Depression controlled with bupropion. Anxiety and insomnia present.  Psychiatric/Behavioral: Positive for sleep disturbance. Negative for suicidal ideas, hallucinations, behavioral problems, confusion, self-injury and dysphoric mood. The patient is nervous/anxious. The patient is not hyperactive.        Objective:   Physical Exam Neurologic Exam  Mental status: The patient is oriented x3.  Cranial nerves: Facial symmetry is present. Speech is normal, no aphasia or dysarthrias is noted. Extraocular movement are full. Visual fields are full.  Motor: The patient has good strength in all 4 extremities.  Sensory examination: Soft touch sensation on the face, arms, and legs is symmetric.  Coordination: The patient has good finger-nose-finger.  Lung- clear, even and unlabored.  Heart- Regular, rate and rythm       Assessment &  Plan:

## 2013-09-22 NOTE — Progress Notes (Signed)
Pre visit review using our clinic review tool, if applicable. No additional management support is needed unless otherwise documented below in the visit note. 

## 2013-10-23 ENCOUNTER — Encounter: Payer: Self-pay | Admitting: Family Medicine

## 2013-10-23 ENCOUNTER — Ambulatory Visit (INDEPENDENT_AMBULATORY_CARE_PROVIDER_SITE_OTHER): Payer: BC Managed Care – PPO | Admitting: Family Medicine

## 2013-10-23 VITALS — BP 120/84 | HR 88 | Temp 98.2°F | Resp 16 | Wt 199.4 lb

## 2013-10-23 DIAGNOSIS — F419 Anxiety disorder, unspecified: Secondary | ICD-10-CM

## 2013-10-23 DIAGNOSIS — Z23 Encounter for immunization: Secondary | ICD-10-CM

## 2013-10-23 DIAGNOSIS — E039 Hypothyroidism, unspecified: Secondary | ICD-10-CM | POA: Insufficient documentation

## 2013-10-23 DIAGNOSIS — F329 Major depressive disorder, single episode, unspecified: Secondary | ICD-10-CM

## 2013-10-23 DIAGNOSIS — F341 Dysthymic disorder: Secondary | ICD-10-CM

## 2013-10-23 HISTORY — DX: Hypothyroidism, unspecified: E03.9

## 2013-10-23 LAB — TSH: TSH: 1.433 u[IU]/mL (ref 0.350–4.500)

## 2013-10-23 NOTE — Progress Notes (Signed)
Pre visit review using our clinic review tool, if applicable. No additional management support is needed unless otherwise documented below in the visit note. 

## 2013-10-23 NOTE — Progress Notes (Signed)
   Subjective:    Patient ID: Stacy Ayala, female    DOB: 12/05/1975, 38 y.o.   MRN: [id number]  HPI Anxiety/depression- much improved.  Pt is very happy w/ job change.  'i love the wellbutrin'.  Pt has to take sleeping medication in order to sleep- either simply sleep or tylenol PM if she has a HA.  Pt saw Percell Miller and was given Klonopin and reports this 'calms my brain but not my body'.  Has lost 35 lbs since stopping Celexa.  Hypothyroid- pt's last TSH was too low and meds were decreased.  Pt reports being asymptomatic at this time but is due for labs.   Review of Systems For ROS see HPI     Objective:   Physical Exam  Vitals reviewed. Constitutional: She is oriented to person, place, and time. She appears well-developed and well-nourished. No distress.  HENT:  Head: Normocephalic and atraumatic.  Neck: Normal range of motion. Neck supple. No thyromegaly present.  Cardiovascular: Normal rate, regular rhythm, normal heart sounds and intact distal pulses.   Pulmonary/Chest: Effort normal and breath sounds normal. No respiratory distress. She has no wheezes. She has no rales.  Neurological: She is alert and oriented to person, place, and time.  Skin: Skin is warm and dry.  Psychiatric: She has a normal mood and affect. Her behavior is normal. Thought content normal.          Assessment & Plan:

## 2013-10-23 NOTE — Patient Instructions (Signed)
Schedule your complete physical for March No changes to meds at this time [facility] notify of your lab results and make any med changes if needed Keep up the good work!  You look great! Happy Fall!!!

## 2013-10-25 NOTE — Assessment & Plan Note (Signed)
Ongoing issue.  meds were adjusted at last visit due to low TSH.  Repeat TSH and adjust meds prn.

## 2013-10-25 NOTE — Assessment & Plan Note (Signed)
Improved.  Continue the Wellbutrin and klonopin prn.  Pt reports feeling MUCH better.  No changes at this time.  Will follow.

## 2013-10-26 ENCOUNTER — Encounter: Payer: Self-pay | Admitting: General Practice

## 2013-12-08 ENCOUNTER — Telehealth: Payer: Self-pay

## 2013-12-09 ENCOUNTER — Other Ambulatory Visit: Payer: Self-pay

## 2013-12-09 ENCOUNTER — Telehealth: Payer: Self-pay | Admitting: Medical

## 2013-12-09 MED ORDER — CLONAZEPAM 0.5 MG PO TABS: 0.5000 mg | ORAL_TABLET | Freq: Two times a day (BID) | ORAL | Status: DC | PRN

## 2013-12-09 NOTE — Telephone Encounter (Signed)
Rx printed for signature . Patient notified,adviised of need to be seen for refill. States she was seen by Dr. Darene Lamer who approved refills of this medication. Told patient I would inform ES of this.

## 2013-12-09 NOTE — Telephone Encounter (Signed)
Left Rx for Clonazepam at front desk for patient to pick up. Patient notified. Advised that she would need to be seen by provider for future refills.

## 2013-12-09 NOTE — Telephone Encounter (Signed)
You can print out #20 tabs of clonazapem. Same as last rx. No refills. She can pick that up. She needs to be seen before next refill. Discuss options of meds. Notify pt. If she gets this repetitively would need controlled med contract signed. So needs appointment when she runs out of this next prescription.

## 2013-12-14 ENCOUNTER — Encounter: Payer: Self-pay | Admitting: Family Medicine

## 2013-12-15 ENCOUNTER — Other Ambulatory Visit: Payer: Self-pay | Admitting: General Practice

## 2013-12-15 ENCOUNTER — Other Ambulatory Visit: Payer: Self-pay

## 2013-12-15 MED ORDER — BUPROPION HCL ER (XL) 300 MG PO TB24: 300.0000 mg | ORAL_TABLET | Freq: Every day | ORAL | Status: DC

## 2013-12-21 ENCOUNTER — Telehealth: Payer: Self-pay | Admitting: Family Medicine

## 2013-12-21 MED ORDER — CLONAZEPAM 0.5 MG PO TABS: 0.5000 mg | ORAL_TABLET | Freq: Two times a day (BID) | ORAL | Status: DC | PRN

## 2013-12-21 NOTE — Telephone Encounter (Signed)
Refill of clonazepam .5 mg

## 2013-12-21 NOTE — Telephone Encounter (Signed)
Ok for #30 

## 2013-12-21 NOTE — Telephone Encounter (Signed)
Med filled.  

## 2013-12-21 NOTE — Telephone Encounter (Signed)
Last OV 10-23-13 Clonazepam last filled 12-09-13 #20 with 0

## 2013-12-30 ENCOUNTER — Other Ambulatory Visit (HOSPITAL_COMMUNITY)
Admission: RE | Admit: 2013-12-30 | Discharge: 2013-12-30 | Disposition: A | Discharge status: Home / Self Care | Payer: BC Managed Care – PPO | Source: Ambulatory Visit | Attending: Family Medicine | Admitting: Family Medicine

## 2013-12-30 ENCOUNTER — Encounter: Payer: Self-pay | Admitting: Family Medicine

## 2013-12-30 ENCOUNTER — Ambulatory Visit (INDEPENDENT_AMBULATORY_CARE_PROVIDER_SITE_OTHER): Payer: BC Managed Care – PPO | Admitting: Family Medicine

## 2013-12-30 ENCOUNTER — Ambulatory Visit: Payer: BC Managed Care – PPO | Admitting: Family Medicine

## 2013-12-30 VITALS — BP 120/80 | HR 101 | Temp 98.1°F | Resp 16 | Wt 187.5 lb

## 2013-12-30 DIAGNOSIS — N760 Acute vaginitis: Secondary | ICD-10-CM | POA: Diagnosis not present

## 2013-12-30 DIAGNOSIS — N898 Other specified noninflammatory disorders of vagina: Secondary | ICD-10-CM

## 2013-12-30 NOTE — Progress Notes (Signed)
   Subjective:    Patient ID: Stacy Ayala, female    DOB: Feb 08, 1976, 38 y.o.   MRN: [id number]  HPI Vaginal d/c- occuring intermittently x3 weeks.  When d/c is present it has bad odor.  Pt reports d/c is 'thick and grey'.  No itching.  No new or different sex partners.   Review of Systems For ROS see HPI     Objective:   Physical Exam  Constitutional: She is oriented to person, place, and time. She appears well-developed and well-nourished. No distress.  Genitourinary: Vagina normal. No vaginal discharge found.  No external abnormalities- no rashes, lesions, obvious d/c  Neurological: She is alert and oriented to person, place, and time.  Skin: Skin is warm and dry.  Psychiatric: She has a normal mood and affect. Her behavior is normal. Thought content normal.  Vitals reviewed.         Assessment & Plan:

## 2013-12-30 NOTE — Patient Instructions (Signed)
Follow up as needed We'll notify you of your lab results and treat as needed If the discharge returns, call me and we'll send something in (even if the test is negative) Continue the probiotic daily Call with any questions or concerns Hang in there! Happy Holidays!

## 2013-12-30 NOTE — Assessment & Plan Note (Signed)
New.  Pt's sxs sound consistent w/ BV.  No obvious odor or d/c on exam today.  Await results of wet prep and tx prn.  Pt expressed understanding and is in agreement w/ plan.

## 2013-12-30 NOTE — Progress Notes (Signed)
Pre visit review using our clinic review tool, if applicable. No additional management support is needed unless otherwise documented below in the visit note. 

## 2014-01-01 LAB — CERVICOVAGINAL ANCILLARY ONLY
Wet Prep (BD Affirm): NEGATIVE
Wet Prep (BD Affirm): NEGATIVE
Wet Prep (BD Affirm): POSITIVE — AB

## 2014-01-04 ENCOUNTER — Other Ambulatory Visit: Payer: Self-pay | Admitting: General Practice

## 2014-01-04 MED ORDER — METRONIDAZOLE 500 MG PO TABS: 500.0000 mg | ORAL_TABLET | Freq: Two times a day (BID) | ORAL | Status: DC

## 2014-02-01 ENCOUNTER — Encounter: Payer: Self-pay | Admitting: Family Medicine

## 2014-02-02 MED ORDER — METRONIDAZOLE 500 MG PO TABS: 500.0000 mg | ORAL_TABLET | Freq: Two times a day (BID) | ORAL | Status: DC

## 2014-02-02 NOTE — Telephone Encounter (Signed)
Med filled and pt notified.  

## 2014-02-19 ENCOUNTER — Other Ambulatory Visit: Payer: Self-pay | Admitting: General Practice

## 2014-02-19 MED ORDER — LEVOTHYROXINE SODIUM 50 MCG PO TABS
50.0000 ug | ORAL_TABLET | Freq: Every day | ORAL | Status: DC
Start: 2014-02-19 — End: 2014-05-20

## 2014-04-19 ENCOUNTER — Telehealth: Payer: Self-pay

## 2014-04-19 NOTE — Telephone Encounter (Signed)
Medication: Review, verify sig & reconcile(including outside meds): yes Duplicates discarded: n/a DM supply source: n/a  Preferred Pharmacy and which med where: 90 day supply/mail order: n/a Local pharmacy: [facility] - [place], [place] - [address] and [place], [place] verified:  yes  Immunization Status: Prompted for insurance verification: n/a Flu vaccine--10/23/13 Tdap--10/23/13   A/P:   Changes to [place], PSH or Personal Hx: Reviewed.  No changes. Pap--02/27/12- normal MMG--12/07/10-benign findings  Care Teams Updated: ED/Hospital/Urgent Care Visits: No Prompted for: Updated insurance, contact information, forms: yes Remind to bring: DPR information, advance directives: yes  To Discuss with Provider: Pt would like a Pap Smear with physical.

## 2014-04-21 ENCOUNTER — Ambulatory Visit (INDEPENDENT_AMBULATORY_CARE_PROVIDER_SITE_OTHER): Payer: BC Managed Care – PPO | Admitting: Family Medicine

## 2014-04-21 ENCOUNTER — Encounter: Payer: Self-pay | Admitting: Family Medicine

## 2014-04-21 ENCOUNTER — Other Ambulatory Visit (HOSPITAL_COMMUNITY)
Admission: RE | Admit: 2014-04-21 | Discharge: 2014-04-21 | Disposition: A | Discharge status: Home / Self Care | Payer: BC Managed Care – PPO | Source: Ambulatory Visit | Attending: Family Medicine | Admitting: Family Medicine

## 2014-04-21 VITALS — BP 124/66 | HR 86 | Temp 98.5°F | Resp 14 | Ht 65.0 in | Wt 179.4 lb

## 2014-04-21 DIAGNOSIS — Z124 Encounter for screening for malignant neoplasm of cervix: Secondary | ICD-10-CM | POA: Diagnosis not present

## 2014-04-21 DIAGNOSIS — D239 Other benign neoplasm of skin, unspecified: Secondary | ICD-10-CM | POA: Diagnosis not present

## 2014-04-21 DIAGNOSIS — Z01419 Encounter for gynecological examination (general) (routine) without abnormal findings: Secondary | ICD-10-CM

## 2014-04-21 DIAGNOSIS — N760 Acute vaginitis: Secondary | ICD-10-CM | POA: Diagnosis present

## 2014-04-21 DIAGNOSIS — Z01411 Encounter for gynecological examination (general) (routine) with abnormal findings: Secondary | ICD-10-CM | POA: Insufficient documentation

## 2014-04-21 DIAGNOSIS — M7989 Other specified soft tissue disorders: Secondary | ICD-10-CM

## 2014-04-21 DIAGNOSIS — R87810 Cervical high risk human papillomavirus (HPV) DNA test positive: Secondary | ICD-10-CM | POA: Diagnosis present

## 2014-04-21 DIAGNOSIS — Z1151 Encounter for screening for human papillomavirus (HPV): Secondary | ICD-10-CM | POA: Insufficient documentation

## 2014-04-21 DIAGNOSIS — M799 Soft tissue disorder, unspecified: Secondary | ICD-10-CM

## 2014-04-21 DIAGNOSIS — D229 Melanocytic nevi, unspecified: Secondary | ICD-10-CM

## 2014-04-21 HISTORY — DX: Other specified soft tissue disorders: M79.89

## 2014-04-21 NOTE — Progress Notes (Signed)
Pre visit review using our clinic review tool, if applicable. No additional management support is needed unless otherwise documented below in the visit note. 

## 2014-04-21 NOTE — Patient Instructions (Signed)
Follow up in 1 year or as needed We'll notify you of your lab results and make any changes if needed We'll call you with your derm appt Keep up the good work on healthy diet and regular exercise- you look great! Call with any questions or concerns [place]!!!

## 2014-04-21 NOTE — Progress Notes (Signed)
   Subjective:    Patient ID: Stacy Ayala, female    DOB: November 10, 1975, 39 y.o.   MRN: [id number]  HPI CPE- L forearm 'lump', slightly enlarging, now intermittently painful.   Review of Systems Patient reports no vision/ hearing changes, adenopathy,fever, weight change,  persistant/recurrent hoarseness , swallowing issues, chest pain, palpitations, edema, persistant/recurrent cough, hemoptysis, dyspnea (rest/exertional/paroxysmal nocturnal), gastrointestinal bleeding (melena, rectal bleeding), abdominal pain, significant heartburn, bowel changes, GU symptoms (dysuria, hematuria, incontinence), Gyn symptoms (abnormal  bleeding, pain),  syncope, focal weakness, memory loss, numbness & tingling, skin/hair/nail changes, abnormal bruising or bleeding, anxiety, or depression.   Reviewed meds, allergies, problem list, and PMH in chart     Objective:   Physical Exam  General Appearance:    Alert, cooperative, no distress, appears stated age  Head:    Normocephalic, without obvious abnormality, atraumatic  Eyes:    PERRL, conjunctiva/corneas clear, EOM's intact, fundi    benign, both eyes  Ears:    Normal TM's and external ear canals, both ears  Nose:   Nares normal, septum midline, mucosa normal, no drainage    or sinus tenderness  Throat:   Lips, mucosa, and tongue normal; teeth and gums normal  Neck:   Supple, symmetrical, trachea midline, no adenopathy;    Thyroid: no enlargement/tenderness/nodules  Back:     Symmetric, no curvature, ROM normal, no CVA tenderness  Lungs:     Clear to auscultation bilaterally, respirations unlabored  Chest Wall:    No tenderness or deformity   Heart:    Regular rate and rhythm, S1 and S2 normal, no murmur, rub   or gallop  Breast Exam:    No tenderness, masses, or nipple abnormality  Abdomen:     Soft, non-tender, bowel sounds active all four quadrants,    no masses, no organomegaly  Genitalia:    External genitalia normal, cervix normal in appearance, no CMT,  uterus in normal size and position, adnexa w/out mass or tenderness, mucosa pink and moist, no lesions or discharge present  Rectal:    Normal external appearance  Extremities:   Extremities normal, atraumatic, no cyanosis or edema.  Small, mobile L forearm soft tissue mass  Pulses:   2+ and symmetric all extremities  Skin:   Skin color, texture, turgor normal, no rashes or lesions  Lymph nodes:   Cervical, supraclavicular, and axillary nodes normal  Neurologic:   CNII-XII intact, normal strength, sensation and reflexes    throughout          Assessment & Plan:

## 2014-04-22 ENCOUNTER — Other Ambulatory Visit: Payer: Self-pay | Admitting: Family Medicine

## 2014-04-22 ENCOUNTER — Ambulatory Visit (HOSPITAL_BASED_OUTPATIENT_CLINIC_OR_DEPARTMENT_OTHER)
Admission: RE | Admit: 2014-04-22 | Discharge: 2014-04-22 | Disposition: A | Discharge status: Home / Self Care | Payer: BC Managed Care – PPO | Source: Ambulatory Visit | Attending: Family Medicine | Admitting: Family Medicine

## 2014-04-22 ENCOUNTER — Other Ambulatory Visit: Payer: Self-pay | Admitting: General Practice

## 2014-04-22 DIAGNOSIS — M799 Soft tissue disorder, unspecified: Secondary | ICD-10-CM | POA: Insufficient documentation

## 2014-04-22 DIAGNOSIS — M7989 Other specified soft tissue disorders: Secondary | ICD-10-CM

## 2014-04-22 LAB — CERVICOVAGINAL ANCILLARY ONLY
Wet Prep (BD Affirm): NEGATIVE
Wet Prep (BD Affirm): NEGATIVE
Wet Prep (BD Affirm): POSITIVE — AB

## 2014-04-22 LAB — LIPID PANEL
CHOL/HDL RATIO: 3
Cholesterol: 142 mg/dL (ref 0–200)
HDL: 48.2 mg/dL (ref >39.00)
LDL Cholesterol: 83 mg/dL (ref 0–99)
NONHDL: 93.8
Triglycerides: 54 mg/dL (ref 0.0–149.0)
VLDL: 10.8 mg/dL (ref 0.0–40.0)

## 2014-04-22 LAB — TSH: TSH: 1.5 u[IU]/mL (ref 0.35–4.50)

## 2014-04-22 LAB — CBC WITH DIFFERENTIAL/PLATELET
BASOS PCT: 0.5 % (ref 0.0–3.0)
Basophils Absolute: 0 10*3/uL (ref 0.0–0.1)
Eosinophils Absolute: 0.1 10*3/uL (ref 0.0–0.7)
Eosinophils Relative: 0.9 % (ref 0.0–5.0)
HCT: 38.5 % (ref 36.0–46.0)
Hemoglobin: 13.2 g/dL (ref 12.0–15.0)
LYMPHS ABS: 1.6 10*3/uL (ref 0.7–4.0)
LYMPHS PCT: 23.2 % (ref 12.0–46.0)
MCHC: 34.1 g/dL (ref 30.0–36.0)
MCV: 86 fl (ref 78.0–100.0)
MONO ABS: 0.5 10*3/uL (ref 0.1–1.0)
Monocytes Relative: 7.7 % (ref 3.0–12.0)
NEUTROS ABS: 4.6 10*3/uL (ref 1.4–7.7)
NEUTROS PCT: 67.7 % (ref 43.0–77.0)
Platelets: 245 10*3/uL (ref 150.0–400.0)
RBC: 4.48 Mil/uL (ref 3.87–5.11)
RDW: 12.5 % (ref 11.5–15.5)
WBC: 6.7 10*3/uL (ref 4.0–10.5)

## 2014-04-22 LAB — BASIC METABOLIC PANEL
BUN: 13 mg/dL (ref 6–23)
CO2: 27 mEq/L (ref 19–32)
CREATININE: 0.78 mg/dL (ref 0.40–1.20)
Calcium: 9.4 mg/dL (ref 8.4–10.5)
Chloride: 103 mEq/L (ref 96–112)
GFR: 87.38 mL/min (ref >60.00)
Glucose, Bld: 78 mg/dL (ref 70–99)
Potassium: 4.1 mEq/L (ref 3.5–5.1)
Sodium: 136 mEq/L (ref 135–145)

## 2014-04-22 LAB — HEPATIC FUNCTION PANEL
ALT: 13 U/L (ref 0–35)
AST: 14 U/L (ref 0–37)
Albumin: 4.5 g/dL (ref 3.5–5.2)
Alkaline Phosphatase: 46 U/L (ref 39–117)
BILIRUBIN TOTAL: 0.3 mg/dL (ref 0.2–1.2)
Bilirubin, Direct: 0 mg/dL (ref 0.0–0.3)
Total Protein: 7.4 g/dL (ref 6.0–8.3)

## 2014-04-22 LAB — VITAMIN D 25 HYDROXY (VIT D DEFICIENCY, FRACTURES): VITD: 22.56 ng/mL — ABNORMAL LOW (ref 30.00–100.00)

## 2014-04-22 MED ORDER — VITAMIN D (ERGOCALCIFEROL) 1.25 MG (50000 UNIT) PO CAPS: 50000.0000 [IU] | ORAL_CAPSULE | ORAL | Status: DC

## 2014-04-22 NOTE — Assessment & Plan Note (Signed)
Pap collected. 

## 2014-04-22 NOTE — Assessment & Plan Note (Signed)
Pt's PE WNL.  Due to start mammo next year.  Pap collected.  Check labs.  Anticipatory guidance provided.

## 2014-04-22 NOTE — Assessment & Plan Note (Signed)
Known L forearm mass.  Pt feels this is slightly enlarging and now intermittently painful.  Will get [place] to assess.  Pt expressed understanding and is in agreement w/ plan.

## 2014-04-23 ENCOUNTER — Encounter: Payer: Self-pay | Admitting: Family Medicine

## 2014-04-23 ENCOUNTER — Other Ambulatory Visit: Payer: Self-pay | Admitting: General Practice

## 2014-04-23 ENCOUNTER — Other Ambulatory Visit: Payer: Self-pay | Admitting: Family Medicine

## 2014-04-23 DIAGNOSIS — M7989 Other specified soft tissue disorders: Secondary | ICD-10-CM

## 2014-04-23 MED ORDER — METRONIDAZOLE 500 MG PO TABS: 500.0000 mg | ORAL_TABLET | Freq: Two times a day (BID) | ORAL | Status: DC

## 2014-04-23 NOTE — Telephone Encounter (Signed)
Pt notified and referral was placed.

## 2014-04-24 ENCOUNTER — Ambulatory Visit (HOSPITAL_BASED_OUTPATIENT_CLINIC_OR_DEPARTMENT_OTHER)
Admission: RE | Admit: 2014-04-24 | Discharge: 2014-04-24 | Disposition: A | Discharge status: Home / Self Care | Payer: BC Managed Care – PPO | Source: Ambulatory Visit | Attending: Family Medicine | Admitting: Family Medicine

## 2014-04-24 DIAGNOSIS — M7989 Other specified soft tissue disorders: Secondary | ICD-10-CM

## 2014-04-24 DIAGNOSIS — M799 Soft tissue disorder, unspecified: Secondary | ICD-10-CM | POA: Insufficient documentation

## 2014-04-24 MED ORDER — GADOBENATE DIMEGLUMINE 529 MG/ML IV SOLN: 16.0000 mL | Freq: Once | INTRAVENOUS | Status: AC | PRN

## 2014-04-25 ENCOUNTER — Encounter: Payer: Self-pay | Admitting: Family Medicine

## 2014-04-25 DIAGNOSIS — M7989 Other specified soft tissue disorders: Secondary | ICD-10-CM

## 2014-04-26 LAB — CYTOLOGY - PAP

## 2014-04-26 NOTE — Telephone Encounter (Signed)
Referral placed.

## 2014-04-27 ENCOUNTER — Encounter: Payer: Self-pay | Admitting: Family Medicine

## 2014-04-28 ENCOUNTER — Other Ambulatory Visit: Payer: Self-pay | Admitting: Family Medicine

## 2014-04-28 ENCOUNTER — Encounter: Payer: Self-pay | Admitting: Family Medicine

## 2014-04-28 DIAGNOSIS — B977 Papillomavirus as the cause of diseases classified elsewhere: Secondary | ICD-10-CM

## 2014-05-10 ENCOUNTER — Other Ambulatory Visit: Payer: Self-pay | Admitting: Obstetrics and Gynecology

## 2014-05-16 ENCOUNTER — Other Ambulatory Visit: Payer: Self-pay | Admitting: Family Medicine

## 2014-05-20 ENCOUNTER — Other Ambulatory Visit: Payer: Self-pay | Admitting: Family Medicine

## 2014-05-20 NOTE — Telephone Encounter (Signed)
Med filled.  

## 2014-05-27 ENCOUNTER — Other Ambulatory Visit: Payer: Self-pay | Admitting: Family Medicine

## 2014-05-27 MED ORDER — CLONAZEPAM 0.5 MG PO TABS: 0.5000 mg | ORAL_TABLET | Freq: Two times a day (BID) | ORAL | Status: DC | PRN

## 2014-05-27 NOTE — Telephone Encounter (Signed)
Last OV 04-21-14 Clonazepam last filled 12-21-13 #30 with 0

## 2014-05-27 NOTE — Telephone Encounter (Signed)
Med filled and faxed.  

## 2014-07-21 ENCOUNTER — Other Ambulatory Visit: Payer: Self-pay | Admitting: Family Medicine

## 2014-07-21 NOTE — Telephone Encounter (Signed)
Med filled.  

## 2014-09-27 ENCOUNTER — Other Ambulatory Visit: Payer: Self-pay | Admitting: Family Medicine

## 2014-09-27 NOTE — Telephone Encounter (Signed)
Medication filled to pharmacy as requested.   

## 2014-09-27 NOTE — Telephone Encounter (Signed)
last OV 04/21/14 Clonazepam last filled 05/27/14 #30 with 0

## 2014-11-15 ENCOUNTER — Other Ambulatory Visit: Payer: Self-pay | Admitting: Obstetrics and Gynecology

## 2014-11-17 LAB — CYTOLOGY - PAP

## 2014-12-06 ENCOUNTER — Other Ambulatory Visit: Payer: Self-pay | Admitting: Family Medicine

## 2014-12-06 NOTE — Telephone Encounter (Signed)
Medication filled to pharmacy as requested.   

## 2014-12-13 ENCOUNTER — Encounter: Payer: Self-pay | Admitting: Family Medicine

## 2014-12-13 ENCOUNTER — Ambulatory Visit (INDEPENDENT_AMBULATORY_CARE_PROVIDER_SITE_OTHER): Payer: BC Managed Care – PPO | Admitting: Family Medicine

## 2014-12-13 VITALS — BP 126/78 | HR 80 | Temp 98.0°F | Resp 16 | Ht 65.0 in | Wt 198.0 lb

## 2014-12-13 DIAGNOSIS — E039 Hypothyroidism, unspecified: Secondary | ICD-10-CM

## 2014-12-13 DIAGNOSIS — F418 Other specified anxiety disorders: Secondary | ICD-10-CM

## 2014-12-13 DIAGNOSIS — F329 Major depressive disorder, single episode, unspecified: Secondary | ICD-10-CM

## 2014-12-13 DIAGNOSIS — Z23 Encounter for immunization: Secondary | ICD-10-CM | POA: Diagnosis not present

## 2014-12-13 DIAGNOSIS — F419 Anxiety disorder, unspecified: Secondary | ICD-10-CM

## 2014-12-13 MED ORDER — BUSPIRONE HCL 15 MG PO TABS: 15.0000 mg | ORAL_TABLET | Freq: Two times a day (BID) | ORAL | Status: DC

## 2014-12-13 NOTE — Progress Notes (Signed)
   Subjective:    Patient ID: Stacy Ayala, female    DOB: [birth year], 39 y.o.   MRN: [id number]  HPI Anxiety/depression- pt reports sxs are 'really bad' since returning to work this fall after summer break.  Again having increased tearfulness, decreased self esteem, low self worth.  sxs are far worse around menstrual cycle.  Increased anxiety.  On Wellbutrin daily.  Previously on Celexa.  Using Clonazepam prn.  Pt reports the anxiety is far worse than the depression.  Hypothyroid- pt is currently on 57mcg daily.  + 20 lb weight gain, increased fatigue, depression.     Review of Systems For ROS see HPI     Objective:   Physical Exam  Constitutional: She is oriented to person, place, and time. She appears well-developed and well-nourished. No distress.  HENT:  Head: Normocephalic and atraumatic.  Neurological: She is alert and oriented to person, place, and time.  Skin: Skin is warm and dry.  Psychiatric: She has a normal mood and affect. Her behavior is normal. Thought content normal.  Vitals reviewed.         Assessment & Plan:

## 2014-12-13 NOTE — Patient Instructions (Signed)
Follow up in 3-4 weeks to recheck anxiety Continue the Wellbutrin daily Add the Buspar- start w/ 1/2 tab twice daily x1 week and increase to 1 tab twice daily Please call and schedule an appt w/ either Melinda Crutch or Clint Bolder at [phone] Call with any questions or concerns If you want to join [place] at the new [place] office, any scheduled appointments will automatically transfer and we will see you at [address] [place] [address] [place], [place], [place] [address]  Hang in there!!!

## 2014-12-13 NOTE — Progress Notes (Signed)
Pre visit review using our clinic review tool, if applicable. No additional management support is needed unless otherwise documented below in the visit note. 

## 2014-12-14 LAB — TSH: TSH: 2.18 u[IU]/mL (ref 0.35–4.50)

## 2014-12-14 NOTE — Assessment & Plan Note (Signed)
Chronic problem.  Check labs due to recent fatigue, worsening depression and weight gain.  Adjust meds prn.

## 2014-12-14 NOTE — Assessment & Plan Note (Signed)
Deteriorated.  Pt is again struggling w/ high anxiety- which in turn worsens her depression.  Due to her hx of weight gain w/ SSRI, will start Buspar in addition to her Wellbutrin.  Discussed counseling- pt to call and schedule.  Will follow closely and adjust meds prn.

## 2014-12-31 ENCOUNTER — Other Ambulatory Visit: Payer: Self-pay | Admitting: Family Medicine

## 2014-12-31 NOTE — Telephone Encounter (Signed)
Medication filled to pharmacy as requested.   

## 2015-01-13 ENCOUNTER — Ambulatory Visit (INDEPENDENT_AMBULATORY_CARE_PROVIDER_SITE_OTHER): Payer: BC Managed Care – PPO | Admitting: Family Medicine

## 2015-01-13 ENCOUNTER — Encounter: Payer: Self-pay | Admitting: Family Medicine

## 2015-01-13 VITALS — BP 124/80 | HR 93 | Temp 98.0°F | Resp 16 | Ht 65.0 in | Wt 205.1 lb

## 2015-01-13 DIAGNOSIS — F418 Other specified anxiety disorders: Secondary | ICD-10-CM

## 2015-01-13 DIAGNOSIS — F419 Anxiety disorder, unspecified: Principal | ICD-10-CM

## 2015-01-13 DIAGNOSIS — F329 Major depressive disorder, single episode, unspecified: Secondary | ICD-10-CM

## 2015-01-13 MED ORDER — FLUOXETINE HCL 10 MG PO TABS: 10.0000 mg | ORAL_TABLET | Freq: Every day | ORAL | Status: DC

## 2015-01-13 NOTE — Progress Notes (Signed)
Pre visit review using our clinic review tool, if applicable. No additional management support is needed unless otherwise documented below in the visit note. 

## 2015-01-13 NOTE — Progress Notes (Signed)
   Subjective:    Patient ID: Stacy Ayala, female    DOB: 1976-01-22, 39 y.o.   MRN: [id number]  HPI Anxiety- 'definitely feeling a lot better'.  Had 'crazy experience' 3-4 days after starting medication at onset of menses.  Pt had palpitations, SOB, tearfulness which is not uncommon during her cycles.  Pt reports since starting the medication she has sensation of 'having a tight hat on'- occuring every other day.   Review of Systems For ROS see HPI     Objective:   Physical Exam  Constitutional: She is oriented to person, place, and time. She appears well-developed and well-nourished. No distress.  HENT:  Head: Normocephalic and atraumatic.  Eyes: Conjunctivae and EOM are normal. Pupils are equal, round, and reactive to light.  Neurological: She is alert and oriented to person, place, and time.  Skin: Skin is warm and dry.  Psychiatric: She has a normal mood and affect. Her behavior is normal. Thought content normal.  Vitals reviewed.         Assessment & Plan:

## 2015-01-13 NOTE — Patient Instructions (Signed)
Follow up in 1 month to recheck mood Continue the Wellbutrin daily Add the Prozac daily STOP the Buspar!! Call with any questions or concerns If you want to join [place] at the new [place] office, any scheduled appointments will automatically transfer and we will see you at [address] [place] [address] [place], [place] [address] ([place] 02/15/15) [place] in there!!! Happy Holidays!!!

## 2015-01-14 NOTE — Assessment & Plan Note (Signed)
Ongoing issue for pt.  She reports that anxiety has improved since starting Buspar but pt is having almost daily HAs.  Due to side effects, will stop Buspar and start low dose Prozac and monitor for improvement.  Pt expressed understanding and is in agreement w/ plan.

## 2015-02-28 ENCOUNTER — Encounter: Payer: Self-pay | Admitting: Family Medicine

## 2015-02-28 ENCOUNTER — Ambulatory Visit (INDEPENDENT_AMBULATORY_CARE_PROVIDER_SITE_OTHER): Payer: BC Managed Care – PPO | Admitting: Family Medicine

## 2015-02-28 VITALS — BP 130/100 | HR 98 | Temp 98.1°F | Ht 65.0 in | Wt 203.4 lb

## 2015-02-28 DIAGNOSIS — F329 Major depressive disorder, single episode, unspecified: Secondary | ICD-10-CM

## 2015-02-28 DIAGNOSIS — F418 Other specified anxiety disorders: Secondary | ICD-10-CM

## 2015-02-28 DIAGNOSIS — F419 Anxiety disorder, unspecified: Principal | ICD-10-CM

## 2015-02-28 NOTE — Progress Notes (Signed)
   Subjective:    Patient ID: Stacy Ayala, female    DOB: 08-07-1975, 40 y.o.   MRN: [id number]  HPI Depression- pt is doing much better since starting Prozac at last visit.  No HAs, mood is much improved.  No weight gain.  Increased motivation.  No longer tearful.  'it's amazing!'.  Not requiring the clonazepam.  Sleep is improved.  Pt is doing better at work and at home.   Review of Systems For ROS see HPI     Objective:   Physical Exam  Constitutional: She is oriented to person, place, and time. She appears well-developed and well-nourished. No distress.  HENT:  Head: Normocephalic and atraumatic.  Neurological: She is alert and oriented to person, place, and time.  Skin: Skin is warm and dry.  Psychiatric: She has a normal mood and affect. Her behavior is normal. Thought content normal.  Vitals reviewed.         Assessment & Plan:

## 2015-02-28 NOTE — Progress Notes (Signed)
Pre visit review using our clinic review tool, if applicable. No additional management support is needed unless otherwise documented below in the visit note. 

## 2015-02-28 NOTE — Patient Instructions (Signed)
Schedule your complete physical in March No med changes at this time I'm SO excited for you and that you're feeling better Happy New Year!!!

## 2015-02-28 NOTE — Assessment & Plan Note (Signed)
Much improved since adding Prozac.  No weight gain or other side effects.  Feeling much better.  No med changes at this time.

## 2015-03-25 ENCOUNTER — Encounter: Payer: Self-pay | Admitting: Family Medicine

## 2015-03-25 ENCOUNTER — Ambulatory Visit (INDEPENDENT_AMBULATORY_CARE_PROVIDER_SITE_OTHER): Payer: BC Managed Care – PPO | Admitting: Family Medicine

## 2015-03-25 VITALS — BP 130/100 | HR 123 | Temp 99.2°F | Wt 200.0 lb

## 2015-03-25 DIAGNOSIS — J020 Streptococcal pharyngitis: Secondary | ICD-10-CM

## 2015-03-25 DIAGNOSIS — J111 Influenza due to unidentified influenza virus with other respiratory manifestations: Secondary | ICD-10-CM

## 2015-03-25 DIAGNOSIS — R52 Pain, unspecified: Secondary | ICD-10-CM

## 2015-03-25 DIAGNOSIS — J029 Acute pharyngitis, unspecified: Secondary | ICD-10-CM

## 2015-03-25 LAB — POCT INFLUENZA A/B
Influenza A, POC: POSITIVE — AB
Influenza B, POC: POSITIVE — AB

## 2015-03-25 LAB — POCT RAPID STREP A (OFFICE): RAPID STREP A SCREEN: POSITIVE — AB

## 2015-03-25 MED ORDER — OSELTAMIVIR PHOSPHATE 75 MG PO CAPS: 75.0000 mg | ORAL_CAPSULE | Freq: Two times a day (BID) | ORAL | Status: DC

## 2015-03-25 MED ORDER — AMOXICILLIN 875 MG PO TABS: 875.0000 mg | ORAL_TABLET | Freq: Two times a day (BID) | ORAL | Status: DC

## 2015-03-25 NOTE — Progress Notes (Signed)
   Subjective:    Patient ID: Stacy Ayala, female    DOB: March 29, 1975, 40 y.o.   MRN: [id number]  HPI URI- sxs started 2 days ago w/ sore throat.  + fever, Tm 101.6.  HA, eye pain/pressure, ear fullness, intermittent non-productive cough.  + sore throat.  + sick contacts.  No N/V, abd pain.  + body aches- it hurts to move.   Review of Systems For ROS see HPI     Objective:   Physical Exam  Constitutional: She appears well-developed and well-nourished. No distress.  Obviously not feeling well  HENT:  Head: Normocephalic and atraumatic.  Right Ear: Tympanic membrane normal.  Left Ear: Tympanic membrane normal.  Nose: Mucosal edema and rhinorrhea present. Right sinus exhibits no maxillary sinus tenderness and no frontal sinus tenderness. Left sinus exhibits no maxillary sinus tenderness and no frontal sinus tenderness.  Mouth/Throat: Uvula is midline and mucous membranes are normal. Oropharyngeal exudate, posterior oropharyngeal edema and posterior oropharyngeal erythema present.  Eyes: Conjunctivae and EOM are normal. Pupils are equal, round, and reactive to light.  Neck: Normal range of motion. Neck supple.  Cardiovascular: Normal rate, regular rhythm and normal heart sounds.   Pulmonary/Chest: Effort normal and breath sounds normal. No respiratory distress. She has no wheezes.  + dry cough  Lymphadenopathy:    She has no cervical adenopathy.  Skin: Skin is warm.  Vitals reviewed.         Assessment & Plan:

## 2015-03-25 NOTE — Patient Instructions (Signed)
Follow up as needed Unfortunately, you have both strep and the flu Start the Amoxicillin twice daily- take w/ food Take the Tamiflu twice daily LOTS OF FLUIDS!!! Alternate tylenol and ibuprofen for pain/fever REST! Call with any questions or concerns Hang in there!!!

## 2015-03-25 NOTE — Progress Notes (Signed)
Pre visit review using our clinic review tool, if applicable. No additional management support is needed unless otherwise documented below in the visit note. 

## 2015-03-27 DIAGNOSIS — J111 Influenza due to unidentified influenza virus with other respiratory manifestations: Secondary | ICD-10-CM | POA: Insufficient documentation

## 2015-03-27 DIAGNOSIS — J020 Streptococcal pharyngitis: Secondary | ICD-10-CM | POA: Insufficient documentation

## 2015-03-27 NOTE — Assessment & Plan Note (Signed)
New.  Pt w/ flu like sxs and + test in office.  Start Tamiflu.  Reviewed supportive care and red flags that should prompt return.  Pt expressed understanding and is in agreement w/ plan.

## 2015-03-27 NOTE — Assessment & Plan Note (Signed)
New.  Pt's sxs and PE consistent w/ infxn.  Strep test confirms.  Start abx.  Reviewed supportive care and red flags that should prompt return.  Pt expressed understanding and is in agreement w/ plan.

## 2015-05-18 ENCOUNTER — Other Ambulatory Visit: Payer: Self-pay | Admitting: Family Medicine

## 2015-05-18 NOTE — Telephone Encounter (Signed)
Medication filled to pharmacy as requested.   

## 2015-05-20 ENCOUNTER — Other Ambulatory Visit: Payer: Self-pay | Admitting: Family Medicine

## 2015-05-20 NOTE — Telephone Encounter (Signed)
Medication filled to pharmacy as requested.   

## 2015-06-22 ENCOUNTER — Other Ambulatory Visit: Payer: Self-pay | Admitting: General Practice

## 2015-06-22 MED ORDER — CLONAZEPAM 0.5 MG PO TABS: 0.5000 mg | ORAL_TABLET | Freq: Two times a day (BID) | ORAL | Status: DC | PRN

## 2015-06-22 NOTE — Telephone Encounter (Signed)
Last OV 03/25/15 Clonazepam last filled 09/27/14 #30 with 0

## 2015-06-22 NOTE — Telephone Encounter (Signed)
Medication filled to pharmacy as requested.   

## 2015-07-23 ENCOUNTER — Other Ambulatory Visit: Payer: Self-pay | Admitting: Family Medicine

## 2015-07-25 NOTE — Telephone Encounter (Signed)
Medication filled to pharmacy as requested.   

## 2015-08-24 ENCOUNTER — Other Ambulatory Visit: Payer: Self-pay | Admitting: Family Medicine

## 2015-09-06 ENCOUNTER — Encounter: Payer: Self-pay | Admitting: General Practice

## 2015-09-06 ENCOUNTER — Ambulatory Visit (INDEPENDENT_AMBULATORY_CARE_PROVIDER_SITE_OTHER): Payer: BC Managed Care – PPO | Admitting: Family Medicine

## 2015-09-06 ENCOUNTER — Encounter: Payer: Self-pay | Admitting: Family Medicine

## 2015-09-06 VITALS — BP 126/81 | HR 89 | Temp 98.1°F | Resp 16 | Ht 65.0 in | Wt 213.1 lb

## 2015-09-06 DIAGNOSIS — Z0000 Encounter for general adult medical examination without abnormal findings: Secondary | ICD-10-CM

## 2015-09-06 LAB — BASIC METABOLIC PANEL WITH GFR
BUN: 14 mg/dL (ref 6–23)
CO2: 29 meq/L (ref 19–32)
Calcium: 9.3 mg/dL (ref 8.4–10.5)
Chloride: 105 meq/L (ref 96–112)
Creatinine, Ser: 0.76 mg/dL (ref 0.40–1.20)
GFR: 89.41 mL/min
Glucose, Bld: 78 mg/dL (ref 70–99)
Potassium: 4.9 meq/L (ref 3.5–5.1)
Sodium: 139 meq/L (ref 135–145)

## 2015-09-06 LAB — LIPID PANEL
Cholesterol: 192 mg/dL (ref 0–200)
HDL: 55.9 mg/dL
LDL Cholesterol: 116 mg/dL — ABNORMAL HIGH (ref 0–99)
NonHDL: 136.54
Total CHOL/HDL Ratio: 3
Triglycerides: 103 mg/dL (ref 0.0–149.0)
VLDL: 20.6 mg/dL (ref 0.0–40.0)

## 2015-09-06 LAB — CBC WITH DIFFERENTIAL/PLATELET
BASOS ABS: 0 10*3/uL (ref 0.0–0.1)
Basophils Relative: 0.4 % (ref 0.0–3.0)
EOS PCT: 1.8 % (ref 0.0–5.0)
Eosinophils Absolute: 0.1 10*3/uL (ref 0.0–0.7)
HCT: 41.1 % (ref 36.0–46.0)
HEMOGLOBIN: 13.8 g/dL (ref 12.0–15.0)
LYMPHS ABS: 1.4 10*3/uL (ref 0.7–4.0)
Lymphocytes Relative: 24.9 % (ref 12.0–46.0)
MCHC: 33.7 g/dL (ref 30.0–36.0)
MCV: 86.8 fl (ref 78.0–100.0)
MONOS PCT: 5.8 % (ref 3.0–12.0)
Monocytes Absolute: 0.3 10*3/uL (ref 0.1–1.0)
NEUTROS PCT: 67.1 % (ref 43.0–77.0)
Neutro Abs: 3.9 10*3/uL (ref 1.4–7.7)
Platelets: 273 10*3/uL (ref 150.0–400.0)
RBC: 4.74 Mil/uL (ref 3.87–5.11)
RDW: 13.9 % (ref 11.5–15.5)
WBC: 5.8 10*3/uL (ref 4.0–10.5)

## 2015-09-06 LAB — HEPATIC FUNCTION PANEL
ALBUMIN: 4.3 g/dL (ref 3.5–5.2)
ALK PHOS: 66 U/L (ref 39–117)
ALT: 13 U/L (ref 0–35)
AST: 15 U/L (ref 0–37)
Bilirubin, Direct: 0.1 mg/dL (ref 0.0–0.3)
TOTAL PROTEIN: 7 g/dL (ref 6.0–8.3)
Total Bilirubin: 0.4 mg/dL (ref 0.2–1.2)

## 2015-09-06 LAB — VITAMIN D 25 HYDROXY (VIT D DEFICIENCY, FRACTURES): VITD: 26.45 ng/mL — AB (ref 30.00–100.00)

## 2015-09-06 LAB — TSH: TSH: 2.38 u[IU]/mL (ref 0.35–4.50)

## 2015-09-06 NOTE — Assessment & Plan Note (Signed)
Pt's PE WNL w/ exception of overweight.  UTD on pap/mammo.  Check labs.  Anticipatory guidance provided.

## 2015-09-06 NOTE — Patient Instructions (Addendum)
Follow up in 1 yr or as needed We'll notify you of your lab results and make any changes if needed Try and make healthy food choices and get regular exercise Call with any questions or concerns Have a great summer!!!

## 2015-09-06 NOTE — Progress Notes (Signed)
Pre visit review using our clinic review tool, if applicable. No additional management support is needed unless otherwise documented below in the visit note. 

## 2015-09-06 NOTE — Progress Notes (Signed)
   Subjective:    Patient ID: Stacy Ayala, female    DOB: 1975/03/03, 40 y.o.   MRN: [id number]  HPI CPE- UTD on pap, mammo.  UTD on Tdap.     Review of Systems Patient reports no vision/ hearing changes, adenopathy,fever, weight change,  persistant/recurrent hoarseness , swallowing issues, chest pain, palpitations, edema, persistant/recurrent cough, hemoptysis, dyspnea (rest/exertional/paroxysmal nocturnal), gastrointestinal bleeding (melena, rectal bleeding), abdominal pain, significant heartburn, bowel changes, GU symptoms (dysuria, hematuria, incontinence), Gyn symptoms (abnormal  bleeding, pain),  syncope, focal weakness, memory loss, numbness & tingling, skin/hair/nail changes, abnormal bruising or bleeding, anxiety, or depression.     Objective:   Physical Exam General Appearance:    Alert, cooperative, no distress, appears stated age  Head:    Normocephalic, without obvious abnormality, atraumatic  Eyes:    PERRL, conjunctiva/corneas clear, EOM's intact, fundi    benign, both eyes  Ears:    Normal TM's and external ear canals, both ears  Nose:   Nares normal, septum midline, mucosa normal, no drainage    or sinus tenderness  Throat:   Lips, mucosa, and tongue normal; teeth and gums normal  Neck:   Supple, symmetrical, trachea midline, no adenopathy;    Thyroid: no enlargement/tenderness/nodules  Back:     Symmetric, no curvature, ROM normal, no CVA tenderness  Lungs:     Clear to auscultation bilaterally, respirations unlabored  Chest Wall:    No tenderness or deformity   Heart:    Regular rate and rhythm, S1 and S2 normal, no murmur, rub   or gallop  Breast Exam:    Deferred to GYN  Abdomen:     Soft, non-tender, bowel sounds active all four quadrants,    no masses, no organomegaly  Genitalia:    Deferred to GYN  Rectal:    Extremities:   Extremities normal, atraumatic, no cyanosis or edema  Pulses:   2+ and symmetric all extremities  Skin:   Skin color, texture, turgor  normal, no rashes or lesions  Lymph nodes:   Cervical, supraclavicular, and axillary nodes normal  Neurologic:   CNII-XII intact, normal strength, sensation and reflexes    throughout          Assessment & Plan:

## 2015-09-07 ENCOUNTER — Encounter: Payer: Self-pay | Admitting: General Practice

## 2015-11-28 ENCOUNTER — Other Ambulatory Visit: Payer: Self-pay | Admitting: Family Medicine

## 2015-12-25 ENCOUNTER — Other Ambulatory Visit: Payer: Self-pay | Admitting: Family Medicine

## 2016-02-27 ENCOUNTER — Ambulatory Visit (INDEPENDENT_AMBULATORY_CARE_PROVIDER_SITE_OTHER): Payer: BC Managed Care – PPO | Admitting: Family Medicine

## 2016-02-27 ENCOUNTER — Encounter: Payer: Self-pay | Admitting: Family Medicine

## 2016-02-27 VITALS — BP 128/90 | HR 88 | Temp 98.1°F | Resp 16 | Ht 65.0 in | Wt 226.0 lb

## 2016-02-27 DIAGNOSIS — F418 Other specified anxiety disorders: Secondary | ICD-10-CM | POA: Diagnosis not present

## 2016-02-27 DIAGNOSIS — F329 Major depressive disorder, single episode, unspecified: Secondary | ICD-10-CM

## 2016-02-27 DIAGNOSIS — F419 Anxiety disorder, unspecified: Principal | ICD-10-CM

## 2016-02-27 MED ORDER — BUPROPION HCL ER (XL) 150 MG PO TB24: 150.0000 mg | ORAL_TABLET | Freq: Every day | ORAL | 3 refills | Status: DC

## 2016-02-27 NOTE — Assessment & Plan Note (Signed)
Improved.  Pt is interested in weaning medication due to weight gain and improved mood.  Plan outlined to pt.  Pt expressed understanding and is in agreement w/ plan.

## 2016-02-27 NOTE — Patient Instructions (Addendum)
Schedule your complete physical in July Decrease the Wellbutrin to 150mg  daily (new prescription at the pharmacy) x2-4 weeks and then decrease to every other day x2-4 weeks and then stop! Continue the Prozac while decreasing the Wellbutrin Once you are off the Wellbutrin, you can decrease the Prozac to 1/2 tab daily x2-4 weeks and then stop Call with any questions or concerns- at any time! Happy New Year!!

## 2016-02-27 NOTE — Progress Notes (Signed)
   Subjective:    Patient ID: Stacy Ayala, female    DOB: Sep 06, 1975, 41 y.o.   MRN: [id number]  HPI Anxiety/depression- pt reports 'everything's been really good!'.  She is interested in weaning off medication b/c of weight gain and the fact that she's feeling good.  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     Review of Systems For ROS see HPI     Objective:   Physical Exam  Constitutional: She is oriented to person, place, and time. She appears well-developed and well-nourished. No distress.  HENT:  Head: Normocephalic and atraumatic.  Neurological: She is alert and oriented to person, place, and time.  Skin: Skin is warm and dry.  Psychiatric: She has a normal mood and affect. Her behavior is normal. Thought content normal.  Vitals reviewed.         Assessment & Plan:

## 2016-02-27 NOTE — Progress Notes (Signed)
Pre visit review using our clinic review tool, if applicable. No additional management support is needed unless otherwise documented below in the visit note. 

## 2016-05-28 ENCOUNTER — Encounter: Payer: Self-pay | Admitting: Family Medicine

## 2016-05-28 ENCOUNTER — Ambulatory Visit (INDEPENDENT_AMBULATORY_CARE_PROVIDER_SITE_OTHER): Payer: BC Managed Care – PPO | Admitting: Family Medicine

## 2016-05-28 VITALS — BP 122/82 | HR 89 | Temp 98.0°F | Resp 16 | Ht 65.0 in | Wt 241.2 lb

## 2016-05-28 DIAGNOSIS — F329 Major depressive disorder, single episode, unspecified: Secondary | ICD-10-CM | POA: Diagnosis not present

## 2016-05-28 DIAGNOSIS — F419 Anxiety disorder, unspecified: Secondary | ICD-10-CM | POA: Diagnosis not present

## 2016-05-28 MED ORDER — BUPROPION HCL ER (XL) 150 MG PO TB24: 150.0000 mg | ORAL_TABLET | Freq: Every day | ORAL | 3 refills | Status: DC

## 2016-05-28 NOTE — Progress Notes (Signed)
Pre visit review using our clinic review tool, if applicable. No additional management support is needed unless otherwise documented below in the visit note. 

## 2016-05-28 NOTE — Patient Instructions (Addendum)
Follow up in 4-6 weeks to recheck mood, sleep, and weight Restart the Wellbutrin 150mg  daily Try and focus on stress outlet- you deserve it! Healthy diet and regular exercise- you can do it!!! Call with any questions or concerns Happy Spring!!!

## 2016-05-28 NOTE — Progress Notes (Signed)
   Subjective:    Patient ID: Stacy Ayala, female    DOB: 1976/01/19, 41 y.o.   MRN: [id number]  HPI Insomnia- pt reports that as she's falling asleep, 1 of her legs (but has been both) will jerk and it will wake her up.  She will get up and read- sxs will completely resolve- but sxs return as soon as she tries to sleep.  sxs will worsen as anxiety worsens.  Pt reports since weaning off meds her anxiety and depression are worse.  Pt has gained nearly 20 lbs since visit.  Decreased motivation, decreased energy level, catastrophic thinking.     Review of Systems For ROS see HPI     Objective:   Physical Exam  Constitutional: She is oriented to person, place, and time. She appears well-developed and well-nourished. No distress.  obese  HENT:  Head: Normocephalic and atraumatic.  Neurological: She is alert and oriented to person, place, and time.  Skin: Skin is warm and dry.  Psychiatric: She has a normal mood and affect. Her behavior is normal. Thought content normal.  Vitals reviewed.         Assessment & Plan:

## 2016-05-28 NOTE — Assessment & Plan Note (Signed)
Deteriorated since weaning off meds.  Will restart Wellbutrin as this can help w/ weight gain, motivation, and leg movements.  Pt is agreeable to this.  Discussed that she may need to remain on medication long term as pt has relapses each time we try and wean off labs.

## 2016-06-07 LAB — HM MAMMOGRAPHY: HM MAMMO: NORMAL (ref 0–4)

## 2016-06-21 ENCOUNTER — Other Ambulatory Visit: Payer: Self-pay | Admitting: Family Medicine

## 2016-06-22 NOTE — Telephone Encounter (Signed)
Medication filled to pharmacy as requested.   

## 2016-06-25 ENCOUNTER — Encounter: Payer: Self-pay | Admitting: Family Medicine

## 2016-06-25 ENCOUNTER — Ambulatory Visit (INDEPENDENT_AMBULATORY_CARE_PROVIDER_SITE_OTHER): Payer: BC Managed Care – PPO | Admitting: Family Medicine

## 2016-06-25 VITALS — BP 121/81 | HR 86 | Temp 98.0°F | Resp 17 | Ht 65.0 in | Wt 241.4 lb

## 2016-06-25 DIAGNOSIS — F329 Major depressive disorder, single episode, unspecified: Secondary | ICD-10-CM

## 2016-06-25 DIAGNOSIS — F419 Anxiety disorder, unspecified: Secondary | ICD-10-CM | POA: Diagnosis not present

## 2016-06-25 MED ORDER — BUPROPION HCL ER (XL) 300 MG PO TB24: 300.0000 mg | ORAL_TABLET | Freq: Every day | ORAL | 3 refills | Status: DC

## 2016-06-25 NOTE — Patient Instructions (Signed)
Follow up by phone or MyChart in 3-4 weeks to let me know how you're doing Increase the Wellbutrin to 300mg  daily- 2 of what you have and 1 of the new prescription Call with any questions or concerns Hang in there!!!

## 2016-06-25 NOTE — Progress Notes (Signed)
Pre visit review using our clinic review tool, if applicable. No additional management support is needed unless otherwise documented below in the visit note. 

## 2016-06-25 NOTE — Progress Notes (Signed)
   Subjective:    Patient ID: Stacy Ayala, female    DOB: [birth year], 41 y.o.   MRN: [id number]  HPI Anxiety- ongoing issue for pt.  Restarted Wellbutrin at last visit.  She reports that this is helping.  Pt reports restless leg has been much better w/ exception of last night.  Pt feels there is room for improvement.  Less irritable, less tearful.     Review of Systems For ROS see HPI     Objective:   Physical Exam  Constitutional: She is oriented to person, place, and time. She appears well-developed and well-nourished. No distress.  HENT:  Head: Normocephalic and atraumatic.  Neurological: She is alert and oriented to person, place, and time.  Skin: Skin is warm and dry.  Psychiatric: She has a normal mood and affect. Her behavior is normal. Thought content normal.  Vitals reviewed.         Assessment & Plan:

## 2016-06-25 NOTE — Assessment & Plan Note (Signed)
Ongoing issue for pt.  sxs have improved since starting Wellbutrin but she feels there is room to be better.  Will increase Wellbutrin to 300mg  daily and continue to monitor.  Pt expressed understanding and is in agreement w/ plan.

## 2016-10-14 ENCOUNTER — Other Ambulatory Visit: Payer: Self-pay | Admitting: Family Medicine

## 2017-01-07 ENCOUNTER — Encounter: Payer: Self-pay | Admitting: Family Medicine

## 2017-01-07 ENCOUNTER — Other Ambulatory Visit: Payer: Self-pay

## 2017-01-07 ENCOUNTER — Ambulatory Visit (INDEPENDENT_AMBULATORY_CARE_PROVIDER_SITE_OTHER): Payer: BC Managed Care – PPO | Admitting: Family Medicine

## 2017-01-07 VITALS — BP 126/80 | HR 103 | Temp 98.1°F | Resp 16 | Ht 65.0 in | Wt 240.4 lb

## 2017-01-07 DIAGNOSIS — Z0000 Encounter for general adult medical examination without abnormal findings: Secondary | ICD-10-CM

## 2017-01-07 DIAGNOSIS — E559 Vitamin D deficiency, unspecified: Secondary | ICD-10-CM

## 2017-01-07 DIAGNOSIS — E66813 Obesity, class 3: Secondary | ICD-10-CM

## 2017-01-07 DIAGNOSIS — Z23 Encounter for immunization: Secondary | ICD-10-CM | POA: Diagnosis not present

## 2017-01-07 HISTORY — DX: Vitamin D deficiency, unspecified: E55.9

## 2017-01-07 HISTORY — DX: Obesity, class 3: E66.813

## 2017-01-07 HISTORY — DX: Morbid (severe) obesity due to excess calories: E66.01

## 2017-01-07 MED ORDER — CLONAZEPAM 0.5 MG PO TABS: 0.5000 mg | ORAL_TABLET | Freq: Every day | ORAL | 2 refills | Status: DC

## 2017-01-07 NOTE — Progress Notes (Signed)
   Subjective:    Patient ID: Stacy Ayala, female    DOB: 1975-08-25, 41 y.o.   MRN: [id number]  HPI CPE- UTD on pap, mammo w/ [facility].  UTD on Tdap.  Flu today.  No regular exercise- plans to restart b/c she is very self conscious of her weight.   Review of Systems Patient reports no vision/ hearing changes, adenopathy,fever, weight change,  persistant/recurrent hoarseness , swallowing issues, chest pain, palpitations, edema, persistant/recurrent cough, hemoptysis, dyspnea (rest/exertional/paroxysmal nocturnal), gastrointestinal bleeding (melena, rectal bleeding), abdominal pain, significant heartburn, bowel changes, GU symptoms (dysuria, hematuria, incontinence), Gyn symptoms (abnormal  bleeding, pain),  syncope, focal weakness, memory loss, numbness & tingling, skin/hair/nail changes, abnormal bruising or bleeding, anxiety, or depression.     Objective:   Physical Exam General Appearance:    Alert, cooperative, no distress, appears stated age, obese  Head:    Normocephalic, without obvious abnormality, atraumatic  Eyes:    PERRL, conjunctiva/corneas clear, EOM's intact, fundi    benign, both eyes  Ears:    Normal TM's and external ear canals, both ears  Nose:   Nares normal, septum midline, mucosa normal, no drainage    or sinus tenderness  Throat:   Lips, mucosa, and tongue normal; teeth and gums normal  Neck:   Supple, symmetrical, trachea midline, no adenopathy;    Thyroid: no enlargement/tenderness/nodules  Back:     Symmetric, no curvature, ROM normal, no CVA tenderness  Lungs:     Clear to auscultation bilaterally, respirations unlabored  Chest Wall:    No tenderness or deformity   Heart:    Regular rate and rhythm, S1 and S2 normal, no murmur, rub   or gallop  Breast Exam:    Deferred to GYN  Abdomen:     Soft, non-tender, bowel sounds active all four quadrants,    no masses, no organomegaly  Genitalia:    Deferred to GYN  Rectal:    Extremities:   Extremities  normal, atraumatic, no cyanosis or edema  Pulses:   2+ and symmetric all extremities  Skin:   Skin color, texture, turgor normal, no rashes or lesions  Lymph nodes:   Cervical, supraclavicular, and axillary nodes normal  Neurologic:   CNII-XII intact, normal strength, sensation and reflexes    throughout          Assessment & Plan:

## 2017-01-07 NOTE — Assessment & Plan Note (Signed)
Pt has hx of this.  Check labs and replete prn. 

## 2017-01-07 NOTE — Assessment & Plan Note (Signed)
Pt's PE WNL w/ exception of obesity.  UTD on GYN, Tdap.  Flu shot given today.  Check labs.  Anticipatory guidance provided.

## 2017-01-07 NOTE — Patient Instructions (Signed)
Follow up in 6 months to recheck weight loss progress We'll notify you of your lab results and make any changes if needed Try and make healthy diet and regular exercise- you can do it!!! If the anxiety changes or worsens- let me know! Call with any questions or concerns Happy Holidays!!!

## 2017-01-07 NOTE — Assessment & Plan Note (Signed)
Ongoing issue for pt.  Stressed need for healthy diet and regular exercise.  Check labs to risk stratify.  Will follow 

## 2017-01-08 LAB — LIPID PANEL
Cholesterol: 173 mg/dL (ref <200)
HDL: 48 mg/dL — AB (ref >50)
LDL Cholesterol (Calc): 103 mg/dL (calc) — ABNORMAL HIGH
NON-HDL CHOLESTEROL (CALC): 125 mg/dL (ref <130)
TRIGLYCERIDES: 122 mg/dL (ref <150)
Total CHOL/HDL Ratio: 3.6 (calc) (ref <5.0)

## 2017-01-08 LAB — BASIC METABOLIC PANEL
BUN: 15 mg/dL (ref 7–25)
CHLORIDE: 103 mmol/L (ref 98–110)
CO2: 23 mmol/L (ref 20–32)
CREATININE: 0.8 mg/dL (ref 0.50–1.10)
Calcium: 9 mg/dL (ref 8.6–10.2)
GLUCOSE: 87 mg/dL (ref 65–99)
Potassium: 4.4 mmol/L (ref 3.5–5.3)
SODIUM: 137 mmol/L (ref 135–146)

## 2017-01-08 LAB — CBC WITH DIFFERENTIAL/PLATELET
Basophils Absolute: 27 cells/uL (ref 0–200)
Basophils Relative: 0.3 %
Eosinophils Absolute: 99 cells/uL (ref 15–500)
Eosinophils Relative: 1.1 %
HCT: 40.9 % (ref 35.0–45.0)
HEMOGLOBIN: 13.9 g/dL (ref 11.7–15.5)
Lymphs Abs: 2133 cells/uL (ref 850–3900)
MCH: 28.8 pg (ref 27.0–33.0)
MCHC: 34 g/dL (ref 32.0–36.0)
MCV: 84.7 fL (ref 80.0–100.0)
MONOS PCT: 6.9 %
MPV: 9.9 fL (ref 7.5–12.5)
NEUTROS PCT: 68 %
Neutro Abs: 6120 cells/uL (ref 1500–7800)
Platelets: 323 10*3/uL (ref 140–400)
RBC: 4.83 10*6/uL (ref 3.80–5.10)
RDW: 12.4 % (ref 11.0–15.0)
TOTAL LYMPHOCYTE: 23.7 %
WBC mixed population: 621 cells/uL (ref 200–950)
WBC: 9 10*3/uL (ref 3.8–10.8)

## 2017-01-08 LAB — HEPATIC FUNCTION PANEL
AG RATIO: 1.4 (calc) (ref 1.0–2.5)
ALT: 20 U/L (ref 6–29)
AST: 17 U/L (ref 10–30)
Albumin: 4.1 g/dL (ref 3.6–5.1)
Alkaline phosphatase (APISO): 73 U/L (ref 33–115)
BILIRUBIN INDIRECT: 0.3 mg/dL (ref 0.2–1.2)
Bilirubin, Direct: 0 mg/dL (ref 0.0–0.2)
GLOBULIN: 3 g/dL (ref 1.9–3.7)
TOTAL PROTEIN: 7.1 g/dL (ref 6.1–8.1)
Total Bilirubin: 0.3 mg/dL (ref 0.2–1.2)

## 2017-01-08 LAB — VITAMIN D 25 HYDROXY (VIT D DEFICIENCY, FRACTURES): VIT D 25 HYDROXY: 20 ng/mL — AB (ref 30–100)

## 2017-01-08 LAB — TSH: TSH: 3.48 mIU/L

## 2017-01-14 ENCOUNTER — Encounter: Payer: Self-pay | Admitting: Family Medicine

## 2017-01-14 ENCOUNTER — Other Ambulatory Visit: Payer: Self-pay | Admitting: General Practice

## 2017-01-14 MED ORDER — VITAMIN D (ERGOCALCIFEROL) 1.25 MG (50000 UNIT) PO CAPS: 50000.0000 [IU] | ORAL_CAPSULE | ORAL | 0 refills | Status: DC

## 2017-02-10 ENCOUNTER — Other Ambulatory Visit: Payer: Self-pay | Admitting: Family Medicine

## 2017-03-15 ENCOUNTER — Other Ambulatory Visit: Payer: Self-pay | Admitting: Family Medicine

## 2017-06-12 ENCOUNTER — Other Ambulatory Visit: Payer: Self-pay | Admitting: Family Medicine

## 2017-07-23 ENCOUNTER — Other Ambulatory Visit: Payer: Self-pay | Admitting: Family Medicine

## 2017-08-02 LAB — HM MAMMOGRAPHY

## 2017-08-09 ENCOUNTER — Encounter: Payer: Self-pay | Admitting: General Practice

## 2017-09-30 ENCOUNTER — Other Ambulatory Visit: Payer: Self-pay | Admitting: Family Medicine

## 2017-09-30 NOTE — Telephone Encounter (Signed)
Last OV 01/07/17 Clonazepam last filled 01/07/17 #30 with 2

## 2017-11-21 ENCOUNTER — Other Ambulatory Visit: Payer: Self-pay | Admitting: Family Medicine

## 2017-12-02 ENCOUNTER — Other Ambulatory Visit: Payer: Self-pay | Admitting: Family Medicine

## 2018-04-10 ENCOUNTER — Encounter: Payer: Self-pay | Admitting: Family Medicine

## 2018-04-10 ENCOUNTER — Other Ambulatory Visit: Payer: Self-pay | Admitting: Family Medicine

## 2018-04-10 MED ORDER — BUPROPION HCL ER (XL) 150 MG PO TB24: 150.0000 mg | ORAL_TABLET | Freq: Every day | ORAL | 0 refills | Status: DC

## 2018-04-11 ENCOUNTER — Encounter: Payer: Self-pay | Admitting: Family Medicine

## 2018-04-28 ENCOUNTER — Encounter: Payer: Self-pay | Admitting: Family Medicine

## 2018-04-28 MED ORDER — CLONAZEPAM 0.5 MG PO TABS: ORAL_TABLET | ORAL | 2 refills | Status: DC

## 2018-04-28 MED ORDER — LEVOTHYROXINE SODIUM 50 MCG PO TABS: 50.0000 ug | ORAL_TABLET | Freq: Every day | ORAL | 0 refills | Status: DC

## 2018-04-28 NOTE — Telephone Encounter (Signed)
Last OV 01/07/17 Upcoming appt April 6th Clonazepam last filled 09/30/17 #30 with 2

## 2018-05-19 ENCOUNTER — Encounter: Payer: BC Managed Care – PPO | Admitting: Family Medicine

## 2018-07-04 ENCOUNTER — Other Ambulatory Visit: Payer: Self-pay

## 2018-07-04 ENCOUNTER — Ambulatory Visit (INDEPENDENT_AMBULATORY_CARE_PROVIDER_SITE_OTHER): Payer: BC Managed Care – PPO | Admitting: Family Medicine

## 2018-07-04 ENCOUNTER — Encounter: Payer: Self-pay | Admitting: Family Medicine

## 2018-07-04 VITALS — BP 132/90 | HR 96 | Temp 97.9°F | Resp 16 | Ht 65.0 in | Wt 259.0 lb

## 2018-07-04 DIAGNOSIS — E559 Vitamin D deficiency, unspecified: Secondary | ICD-10-CM

## 2018-07-04 DIAGNOSIS — Z0000 Encounter for general adult medical examination without abnormal findings: Secondary | ICD-10-CM

## 2018-07-04 DIAGNOSIS — E039 Hypothyroidism, unspecified: Secondary | ICD-10-CM

## 2018-07-04 LAB — HEPATIC FUNCTION PANEL
ALT: 14 U/L (ref 0–35)
AST: 14 U/L (ref 0–37)
Albumin: 4.1 g/dL (ref 3.5–5.2)
Alkaline Phosphatase: 79 U/L (ref 39–117)
Bilirubin, Direct: 0.1 mg/dL (ref 0.0–0.3)
Total Bilirubin: 0.4 mg/dL (ref 0.2–1.2)
Total Protein: 7.2 g/dL (ref 6.0–8.3)

## 2018-07-04 LAB — CBC WITH DIFFERENTIAL/PLATELET
Basophils Absolute: 0 10*3/uL (ref 0.0–0.1)
Basophils Relative: 0.7 % (ref 0.0–3.0)
Eosinophils Absolute: 0.1 10*3/uL (ref 0.0–0.7)
Eosinophils Relative: 1 % (ref 0.0–5.0)
HCT: 41.7 % (ref 36.0–46.0)
Hemoglobin: 14.5 g/dL (ref 12.0–15.0)
Lymphocytes Relative: 23.2 % (ref 12.0–46.0)
Lymphs Abs: 1.5 10*3/uL (ref 0.7–4.0)
MCHC: 34.8 g/dL (ref 30.0–36.0)
MCV: 86 fl (ref 78.0–100.0)
Monocytes Absolute: 0.4 10*3/uL (ref 0.1–1.0)
Monocytes Relative: 6.7 % (ref 3.0–12.0)
Neutro Abs: 4.3 10*3/uL (ref 1.4–7.7)
Neutrophils Relative %: 68.4 % (ref 43.0–77.0)
Platelets: 298 10*3/uL (ref 150.0–400.0)
RBC: 4.85 Mil/uL (ref 3.87–5.11)
RDW: 12.7 % (ref 11.5–15.5)
WBC: 6.3 10*3/uL (ref 4.0–10.5)

## 2018-07-04 LAB — LIPID PANEL
Cholesterol: 193 mg/dL (ref 0–200)
HDL: 48.8 mg/dL (ref >39.00)
LDL Cholesterol: 125 mg/dL — ABNORMAL HIGH (ref 0–99)
NonHDL: 144.05
Total CHOL/HDL Ratio: 4
Triglycerides: 94 mg/dL (ref 0.0–149.0)
VLDL: 18.8 mg/dL (ref 0.0–40.0)

## 2018-07-04 LAB — BASIC METABOLIC PANEL
BUN: 14 mg/dL (ref 6–23)
CO2: 27 mEq/L (ref 19–32)
Calcium: 9.2 mg/dL (ref 8.4–10.5)
Chloride: 104 mEq/L (ref 96–112)
Creatinine, Ser: 0.77 mg/dL (ref 0.40–1.20)
GFR: 81.73 mL/min (ref >60.00)
Glucose, Bld: 91 mg/dL (ref 70–99)
Potassium: 4.8 mEq/L (ref 3.5–5.1)
Sodium: 139 mEq/L (ref 135–145)

## 2018-07-04 LAB — TSH: TSH: 2.6 u[IU]/mL (ref 0.35–4.50)

## 2018-07-04 LAB — VITAMIN D 25 HYDROXY (VIT D DEFICIENCY, FRACTURES): VITD: 15.07 ng/mL — ABNORMAL LOW (ref 30.00–100.00)

## 2018-07-04 NOTE — Assessment & Plan Note (Signed)
Chronic problem.  Pt has had considerable weight gain but she feels this is diet related and not thyroid.  Check labs.  Adjust meds prn

## 2018-07-04 NOTE — Progress Notes (Signed)
Pre visit review using our clinic review tool, if applicable. No additional management support is needed unless otherwise documented below in the visit note. 

## 2018-07-04 NOTE — Patient Instructions (Signed)
Follow up in 1 year or as needed We'll notify you of your lab results and make any changes if needed Continue to work on healthy diet and regular exercise- you can do it!! Call with any questions or concerns Stay Safe!!!

## 2018-07-04 NOTE — Assessment & Plan Note (Signed)
Deteriorated.  Pt has gained 19 lbs.  Stressed need for healthy diet and regular exercise.  Check labs to risk stratify.  Will follow.

## 2018-07-04 NOTE — Progress Notes (Signed)
   Subjective:    Patient ID: Stacy Ayala, female    DOB: September 06, 1975, 43 y.o.   MRN: [id number]  HPI CPE- UTD on mammogram, pap, immunizations.   Review of Systems Patient reports no vision/ hearing changes, adenopathy,fever,  persistant/recurrent hoarseness , swallowing issues, chest pain, palpitations, edema, persistant/recurrent cough, hemoptysis, dyspnea (rest/exertional/paroxysmal nocturnal), gastrointestinal bleeding (melena, rectal bleeding), abdominal pain, significant heartburn, bowel changes, GU symptoms (dysuria, hematuria, incontinence), Gyn symptoms (abnormal  bleeding, pain),  syncope, focal weakness, memory loss, numbness & tingling, skin/hair/nail changes, abnormal bruising or bleeding, anxiety, or depression.  + weight gain- started walking at lunch     Objective:   Physical Exam General Appearance:    Alert, cooperative, no distress, appears stated age, obese  Head:    Normocephalic, without obvious abnormality, atraumatic  Eyes:    PERRL, conjunctiva/corneas clear, EOM's intact, fundi    benign, both eyes  Ears:    Normal TM's and external ear canals, both ears  Nose:   Nares normal, septum midline, mucosa normal, no drainage    or sinus tenderness  Throat:   Lips, mucosa, and tongue normal; teeth and gums normal  Neck:   Supple, symmetrical, trachea midline, no adenopathy;    Thyroid: no enlargement/tenderness/nodules  Back:     Symmetric, no curvature, ROM normal, no CVA tenderness  Lungs:     Clear to auscultation bilaterally, respirations unlabored  Chest Wall:    No tenderness or deformity   Heart:    Regular rate and rhythm, S1 and S2 normal, no murmur, rub   or gallop  Breast Exam:    Deferred to GYN  Abdomen:     Soft, non-tender, bowel sounds active all four quadrants,    no masses, no organomegaly  Genitalia:    Deferred to GYN  Rectal:    Extremities:   Extremities normal, atraumatic, no cyanosis or edema  Pulses:   2+ and symmetric all extremities   Skin:   Skin color, texture, turgor normal, no rashes or lesions  Lymph nodes:   Cervical, supraclavicular, and axillary nodes normal  Neurologic:   CNII-XII intact, normal strength, sensation and reflexes    throughout          Assessment & Plan:

## 2018-07-04 NOTE — Assessment & Plan Note (Signed)
Check labs and replete prn. 

## 2018-07-04 NOTE — Assessment & Plan Note (Signed)
Pt's PE WNL w/ exception of obesity.  UTD on health maintenance.  Check labs.  Anticipatory guidance provided.  

## 2018-07-08 ENCOUNTER — Other Ambulatory Visit: Payer: Self-pay

## 2018-07-08 MED ORDER — VITAMIN D (ERGOCALCIFEROL) 1.25 MG (50000 UNIT) PO CAPS: 50000.0000 [IU] | ORAL_CAPSULE | ORAL | 0 refills | Status: DC

## 2018-07-19 ENCOUNTER — Other Ambulatory Visit: Payer: Self-pay | Admitting: Family Medicine

## 2018-07-20 LAB — HM PAP SMEAR

## 2018-07-20 LAB — HM MAMMOGRAPHY: HM Mammogram: NORMAL (ref 0–4)

## 2018-08-14 ENCOUNTER — Encounter: Payer: BC Managed Care – PPO | Admitting: Family Medicine

## 2018-09-08 ENCOUNTER — Other Ambulatory Visit: Payer: Self-pay | Admitting: Family Medicine

## 2018-10-21 ENCOUNTER — Other Ambulatory Visit: Payer: Self-pay | Admitting: Family Medicine

## 2018-11-19 ENCOUNTER — Encounter: Payer: Self-pay | Admitting: Family Medicine

## 2018-11-19 ENCOUNTER — Other Ambulatory Visit: Payer: Self-pay

## 2018-11-19 ENCOUNTER — Ambulatory Visit (INDEPENDENT_AMBULATORY_CARE_PROVIDER_SITE_OTHER): Payer: BC Managed Care – PPO | Admitting: Family Medicine

## 2018-11-19 VITALS — Ht 65.0 in | Wt 231.0 lb

## 2018-11-19 DIAGNOSIS — F329 Major depressive disorder, single episode, unspecified: Secondary | ICD-10-CM | POA: Diagnosis not present

## 2018-11-19 DIAGNOSIS — F32A Depression, unspecified: Secondary | ICD-10-CM

## 2018-11-19 DIAGNOSIS — F419 Anxiety disorder, unspecified: Secondary | ICD-10-CM | POA: Diagnosis not present

## 2018-11-19 NOTE — Progress Notes (Signed)
I have discussed the procedure for the virtual visit with the patient who has given consent to proceed with assessment and treatment.    L , CMA     

## 2018-11-19 NOTE — Progress Notes (Signed)
   Virtual Visit via Video   I connected with patient on 11/19/18 at  2:00 PM EDT by a video enabled telemedicine application and verified that I am speaking with the correct person using two identifiers.  Location patient: Home Location provider: [facility], Office Persons participating in the virtual visit: Patient, Provider, [place] (Jess B)  I discussed the limitations of evaluation and management by telemedicine and the availability of in person appointments. The patient expressed understanding and agreed to proceed.  Subjective:   HPI:   COVID anxiety- pt has having increased anxiety w/ COVID and her job.  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  Feels like she's overreacting but doesn't want to ignore her fears either.  On Wellbutrin 150mg  daily and has Clonazepam to use as needed  ROS:   See pertinent positives and negatives per HPI.  Patient Active Problem List   Diagnosis Date Noted  . Obesity, Class III, BMI 40-49.9 (morbid obesity) ([place]) 01/07/2017  . Vitamin D deficiency 01/07/2017  . Soft tissue mass 04/21/2014  . Hypothyroidism 10/23/2013  . Screening for malignant neoplasm of cervix 11/23/2010  . Physical exam 11/23/2010  . Breast mass 11/23/2010  . Anxiety and depression 08/17/2010  . CONTUSION OF MULTIPLE SITES NEC 09/05/2009    Social History   Tobacco Use  . Smoking status: Never Smoker  . Smokeless tobacco: Never Used  Substance Use Topics  . Alcohol use: Yes    Comment: occ.    Current Outpatient Medications:  .  Ascorbic Acid (VITAMIN C PO), Take by mouth., Disp: , Rfl:  .  buPROPion (WELLBUTRIN XL) 150 MG 24 hr tablet, Take 1 tablet by mouth once daily, Disp: 90 tablet, Rfl: 0 .  clonazePAM (KLONOPIN) 0.5 MG tablet, TAKE 1 TABLET BY MOUTH ONCE DAILY AT BEDTIME FOR ANXIETY, Disp: 30 tablet, Rfl: 2 .  EUTHYROX 50 MCG tablet, Take 1 tablet by mouth once daily, Disp: 90 tablet, Rfl: 1 .  metroNIDAZOLE (METROGEL) 0.75 %  vaginal gel, , Disp: , Rfl:  .  Multiple Vitamin (MULTIVITAMIN) tablet, Take 1 tablet by mouth daily.  , Disp: , Rfl:  .  Vitamin D, Cholecalciferol, 50 MCG (2000 UT) CAPS, Take by mouth., Disp: , Rfl:   Allergies  Allergen Reactions  . Bactrim [Sulfamethoxazole-Trimethoprim] Rash    Objective:   Ht 5\' 5"  (1.651 m)   Wt 231 lb (104.8 kg)   BMI 38.44 kg/m   AAOx3, NAD NCAT, EOMI No obvious CN deficits Coloring WNL Pt is able to speak clearly, coherently without shortness of breath or increased work of breathing.  Thought process is linear.  Mood is appropriate.   Assessment and Plan:   Anxiety- ongoing issue for pt.  Deteriorated recently w/ the COVID pandemic.  Reassured her that this is common right now and she is not alone.  Encouraged her to verbalize her feelings and not suppress them.  She is not interested in med changes at this time and feels better just knowing that she is not being 'irrational'.  Will continue to follow.   Annye Asa, MD 11/19/2018

## 2018-12-28 ENCOUNTER — Other Ambulatory Visit: Payer: Self-pay | Admitting: Family Medicine

## 2018-12-29 NOTE — Telephone Encounter (Signed)
Last ov 11/19/18 clonazepam last filled 04/28/18 #30 with 2

## 2019-01-18 ENCOUNTER — Other Ambulatory Visit: Payer: Self-pay | Admitting: Family Medicine

## 2019-04-10 ENCOUNTER — Ambulatory Visit: Payer: BC Managed Care – PPO | Attending: Internal Medicine

## 2019-04-10 DIAGNOSIS — Z23 Encounter for immunization: Secondary | ICD-10-CM

## 2019-04-10 NOTE — Progress Notes (Signed)
   Covid-19 Vaccination Clinic  Name:  Stacy Ayala    MRN: [id number] DOB: 01/30/1976  04/10/2019  [name] was observed post Covid-19 immunization for 15 minutes without incidence. She was provided with Vaccine Information Sheet and instruction to access the V-Safe system.   [name] was instructed to call 911 with any severe reactions post vaccine: Marland Kitchen Difficulty breathing  . Swelling of your face and throat  . A fast heartbeat  . A bad rash all over your body  . Dizziness and weakness    Immunizations Administered    Name Date Dose VIS Date Route   Pfizer COVID-19 Vaccine 04/10/2019  8:20 AM 0.3 mL 01/23/2019 Intramuscular   Manufacturer: [place]   Lot: [id number]   [place]: [id number]

## 2019-04-12 ENCOUNTER — Other Ambulatory Visit: Payer: Self-pay | Admitting: Family Medicine

## 2019-04-24 ENCOUNTER — Encounter: Payer: Self-pay | Admitting: Family Medicine

## 2019-05-06 ENCOUNTER — Ambulatory Visit: Payer: BC Managed Care – PPO

## 2019-06-16 ENCOUNTER — Other Ambulatory Visit: Payer: Self-pay | Admitting: Family Medicine

## 2019-06-16 NOTE — Telephone Encounter (Signed)
Last oV 11/19/18 Clonazepam last filled 12/29/18 #30 with 0

## 2019-07-12 ENCOUNTER — Other Ambulatory Visit: Payer: Self-pay | Admitting: Family Medicine

## 2019-08-21 ENCOUNTER — Encounter: Payer: BC Managed Care – PPO | Admitting: Family Medicine

## 2019-08-31 ENCOUNTER — Encounter: Payer: Self-pay | Admitting: Family Medicine

## 2019-08-31 ENCOUNTER — Ambulatory Visit (INDEPENDENT_AMBULATORY_CARE_PROVIDER_SITE_OTHER): Payer: BC Managed Care – PPO | Admitting: Family Medicine

## 2019-08-31 ENCOUNTER — Other Ambulatory Visit: Payer: Self-pay

## 2019-08-31 VITALS — BP 122/81 | HR 82 | Temp 97.8°F | Resp 16 | Ht 65.0 in | Wt 252.0 lb

## 2019-08-31 DIAGNOSIS — F329 Major depressive disorder, single episode, unspecified: Secondary | ICD-10-CM

## 2019-08-31 DIAGNOSIS — E559 Vitamin D deficiency, unspecified: Secondary | ICD-10-CM | POA: Diagnosis not present

## 2019-08-31 DIAGNOSIS — Z0000 Encounter for general adult medical examination without abnormal findings: Secondary | ICD-10-CM | POA: Diagnosis not present

## 2019-08-31 DIAGNOSIS — F419 Anxiety disorder, unspecified: Secondary | ICD-10-CM | POA: Diagnosis not present

## 2019-08-31 MED ORDER — BUPROPION HCL ER (XL) 300 MG PO TB24: 300.0000 mg | ORAL_TABLET | Freq: Every day | ORAL | 3 refills | Status: DC

## 2019-08-31 NOTE — Assessment & Plan Note (Signed)
Deteriorated.  Pt has gained 20 lbs since last visit.  She admits to low motivation, no exercise, and stress eating.  Will treat the underlying depression and stressed need for healthy diet and regular exercise.  Will follow closely.

## 2019-08-31 NOTE — Assessment & Plan Note (Signed)
Deteriorated.  No motivation, doesn't want to get off the couch.  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  Will increase the Wellbutrin to 300mg  daily and monitor closely for improvement.  Pt expressed understanding and is in agreement w/ plan.

## 2019-08-31 NOTE — Progress Notes (Signed)
   Subjective:    Patient ID: Stacy Ayala, female    DOB: 01/08/1976, 44 y.o.   MRN: [id number]  HPI CPE- UTD on pap, mammo.  UTD on vaccines.  Pt has gained 20 lbs since last visit.   Review of Systems Patient reports no vision/ hearing changes, adenopathy,fever, persistant/recurrent hoarseness , swallowing issues, chest pain, palpitations, edema, persistant/recurrent cough, hemoptysis, dyspnea (rest/exertional/paroxysmal nocturnal), gastrointestinal bleeding (melena, rectal bleeding), abdominal pain, significant heartburn, bowel changes, GU symptoms (dysuria, hematuria, incontinence), Gyn symptoms (abnormal  bleeding, pain),  syncope, focal weakness, memory loss, numbness & tingling, skin/hair/nail changes, abnormal bruising or bleeding.   + worsening depression- low motivation, sitting on couch.  + weight gain, no exercise.  This visit occurred during the SARS-CoV-2 public health emergency.  Safety protocols were in place, including screening questions prior to the visit, additional usage of staff PPE, and extensive cleaning of exam room while observing appropriate contact time as indicated for disinfecting solutions.       Objective:   Physical Exam General Appearance:    Alert, cooperative, no distress, appears stated age  Head:    Normocephalic, without obvious abnormality, atraumatic  Eyes:    PERRL, conjunctiva/corneas clear, EOM's intact, fundi    benign, both eyes  Ears:    Normal TM's and external ear canals, both ears  Nose:   Deferred due to COVID  Throat:   Neck:   Supple, symmetrical, trachea midline, no adenopathy;    Thyroid: no enlargement/tenderness/nodules  Back:     Symmetric, no curvature, ROM normal, no CVA tenderness  Lungs:     Clear to auscultation bilaterally, respirations unlabored  Chest Wall:    No tenderness or deformity   Heart:    Regular rate and rhythm, S1 and S2 normal, no murmur, rub   or gallop  Breast Exam:    Deferred to GYN  Abdomen:     Soft,  non-tender, bowel sounds active all four quadrants,    no masses, no organomegaly  Genitalia:    Deferred to GYN  Rectal:    Extremities:   Extremities normal, atraumatic, no cyanosis or edema  Pulses:   2+ and symmetric all extremities  Skin:   Skin color, texture, turgor normal, no rashes or lesions  Lymph nodes:   Cervical, supraclavicular, and axillary nodes normal  Neurologic:   CNII-XII intact, normal strength, sensation and reflexes    throughout          Assessment & Plan:

## 2019-08-31 NOTE — Assessment & Plan Note (Signed)
Pt's PE WNL w/ exception of obesity.  UTD on pap, mammo, immunizations.  Check labs.  Anticipatory guidance provided.  

## 2019-08-31 NOTE — Assessment & Plan Note (Signed)
Pt has hx of this.  Check labs and replete prn. 

## 2019-08-31 NOTE — Patient Instructions (Signed)
Follow up in 4-6 weeks to recheck mood We'll notify you of your lab results and make any changes if needed INCREASE the Wellbutrin to 300mg  daily- 2 of what you have at home and 1 of the new prescription Try and work on healthy diet and regular exercise- you can do it! Call with any questions or concerns Hang in there!!!

## 2019-09-01 ENCOUNTER — Encounter: Payer: Self-pay | Admitting: General Practice

## 2019-09-01 LAB — CBC WITH DIFFERENTIAL/PLATELET
Basophils Absolute: 0 10*3/uL (ref 0.0–0.1)
Basophils Relative: 0.4 % (ref 0.0–3.0)
Eosinophils Absolute: 0.1 10*3/uL (ref 0.0–0.7)
Eosinophils Relative: 1.1 % (ref 0.0–5.0)
HCT: 40.9 % (ref 36.0–46.0)
Hemoglobin: 14.1 g/dL (ref 12.0–15.0)
Lymphocytes Relative: 19.1 % (ref 12.0–46.0)
Lymphs Abs: 1.7 10*3/uL (ref 0.7–4.0)
MCHC: 34.5 g/dL (ref 30.0–36.0)
MCV: 87.8 fl (ref 78.0–100.0)
Monocytes Absolute: 0.6 10*3/uL (ref 0.1–1.0)
Monocytes Relative: 6.3 % (ref 3.0–12.0)
Neutro Abs: 6.6 10*3/uL (ref 1.4–7.7)
Neutrophils Relative %: 73.1 % (ref 43.0–77.0)
Platelets: 285 10*3/uL (ref 150.0–400.0)
RBC: 4.66 Mil/uL (ref 3.87–5.11)
RDW: 12.4 % (ref 11.5–15.5)
WBC: 9 10*3/uL (ref 4.0–10.5)

## 2019-09-01 LAB — HEPATIC FUNCTION PANEL
ALT: 19 U/L (ref 0–35)
AST: 21 U/L (ref 0–37)
Albumin: 4.3 g/dL (ref 3.5–5.2)
Alkaline Phosphatase: 81 U/L (ref 39–117)
Bilirubin, Direct: 0.1 mg/dL (ref 0.0–0.3)
Total Bilirubin: 0.4 mg/dL (ref 0.2–1.2)
Total Protein: 7.4 g/dL (ref 6.0–8.3)

## 2019-09-01 LAB — LIPID PANEL
Cholesterol: 176 mg/dL (ref 0–200)
HDL: 42.5 mg/dL (ref >39.00)
LDL Cholesterol: 105 mg/dL — ABNORMAL HIGH (ref 0–99)
NonHDL: 133.04
Total CHOL/HDL Ratio: 4
Triglycerides: 138 mg/dL (ref 0.0–149.0)
VLDL: 27.6 mg/dL (ref 0.0–40.0)

## 2019-09-01 LAB — VITAMIN D 25 HYDROXY (VIT D DEFICIENCY, FRACTURES): VITD: 42.6 ng/mL (ref 30.00–100.00)

## 2019-09-01 LAB — BASIC METABOLIC PANEL
BUN: 10 mg/dL (ref 6–23)
CO2: 23 mEq/L (ref 19–32)
Calcium: 9.2 mg/dL (ref 8.4–10.5)
Chloride: 103 mEq/L (ref 96–112)
Creatinine, Ser: 0.67 mg/dL (ref 0.40–1.20)
GFR: 95.45 mL/min (ref >60.00)
Glucose, Bld: 80 mg/dL (ref 70–99)
Potassium: 4 mEq/L (ref 3.5–5.1)
Sodium: 136 mEq/L (ref 135–145)

## 2019-09-01 LAB — TSH: TSH: 2.25 u[IU]/mL (ref 0.35–4.50)

## 2019-09-22 ENCOUNTER — Other Ambulatory Visit: Payer: Self-pay | Admitting: Family Medicine

## 2019-09-23 NOTE — Telephone Encounter (Signed)
Last OV 08/31/19 Clonazepam last filled 06/18/19 #30 with 0

## 2019-10-01 ENCOUNTER — Encounter: Payer: Self-pay | Admitting: Family Medicine

## 2019-10-01 ENCOUNTER — Telehealth (INDEPENDENT_AMBULATORY_CARE_PROVIDER_SITE_OTHER): Payer: BC Managed Care – PPO | Admitting: Family Medicine

## 2019-10-01 VITALS — Ht 65.0 in | Wt 252.0 lb

## 2019-10-01 DIAGNOSIS — F419 Anxiety disorder, unspecified: Secondary | ICD-10-CM | POA: Diagnosis not present

## 2019-10-01 DIAGNOSIS — F329 Major depressive disorder, single episode, unspecified: Secondary | ICD-10-CM | POA: Diagnosis not present

## 2019-10-01 MED ORDER — BUPROPION HCL ER (XL) 300 MG PO TB24: 300.0000 mg | ORAL_TABLET | Freq: Every day | ORAL | 3 refills | Status: DC

## 2019-10-01 NOTE — Progress Notes (Signed)
   Virtual Visit via Video   I connected with patient on 10/01/19 at  1:00 PM EDT by a video enabled telemedicine application and verified that I am speaking with the correct person using two identifiers.  Location patient: Home Location provider: Acupuncturist, Office Persons participating in the virtual visit: Patient, Provider, [place] (JoEllen T)  I discussed the limitations of evaluation and management by telemedicine and the availability of in person appointments. The patient expressed understanding and agreed to proceed.  Subjective:   HPI:   Anxiety/Depression- at last visit we increased Wellbutrin to 300mg  daily.  Pt reports mood is better, energy has improved.  Able to get work done for school, better focus.  Is nervous about restarting school w/ COVID spiking but feels her current anxiety about it is manageable.  ROS:   See pertinent positives and negatives per HPI.  Patient Active Problem List   Diagnosis Date Noted  . Obesity, Class III, BMI 40-49.9 (morbid obesity) ([place]) 01/07/2017  . Vitamin D deficiency 01/07/2017  . Soft tissue mass 04/21/2014  . Hypothyroidism 10/23/2013  . Screening for malignant neoplasm of cervix 11/23/2010  . Physical exam 11/23/2010  . Breast mass 11/23/2010  . Anxiety and depression 08/17/2010    Social History   Tobacco Use  . Smoking status: Never Smoker  . Smokeless tobacco: Never Used  Substance Use Topics  . Alcohol use: Yes    Comment: occ.    Current Outpatient Medications:  .  Ascorbic Acid (VITAMIN C PO), Take by mouth., Disp: , Rfl:  .  buPROPion (WELLBUTRIN XL) 300 MG 24 hr tablet, Take 1 tablet (300 mg total) by mouth daily., Disp: 30 tablet, Rfl: 3 .  clonazePAM (KLONOPIN) 0.5 MG tablet, TAKE 1 TABLET BY MOUTH ONCE DAILY AT BEDTIME FOR ANXIETY, Disp: 30 tablet, Rfl: 0 .  Multiple Vitamin (MULTIVITAMIN) tablet, Take 1 tablet by mouth daily.  , Disp: , Rfl:  .  Vitamin D, Cholecalciferol, 50 MCG (2000 UT) CAPS, Take  by mouth., Disp: , Rfl:   Allergies  Allergen Reactions  . Bactrim [Sulfamethoxazole-Trimethoprim] Rash    Objective:   Ht 5\' 5"  (1.651 m)   Wt 252 lb (114.3 kg)   BMI 41.93 kg/m   AAOx3, NAD NCAT, EOMI No obvious CN deficits Coloring WNL Pt is able to speak clearly, coherently without shortness of breath or increased work of breathing.  Thought process is linear.  Mood is appropriate.   Assessment and Plan:   Anxiety/Depression- improved since increasing Wellbutrin to 300mg  daily.  No need to add SSRI at this time.  Will continue to follow.   Annye Asa, MD 10/01/2019

## 2019-10-30 ENCOUNTER — Other Ambulatory Visit: Payer: BC Managed Care – PPO

## 2019-11-19 ENCOUNTER — Other Ambulatory Visit: Payer: Self-pay | Admitting: Physician Assistant

## 2019-11-19 NOTE — Telephone Encounter (Signed)
Klonopin last rx 09/23/19 #30 LOV: 10/01/19 Anxiety and Depression

## 2020-02-21 ENCOUNTER — Other Ambulatory Visit: Payer: Self-pay | Admitting: Family Medicine

## 2020-02-22 NOTE — Telephone Encounter (Signed)
LFD 11/19/19  #30 with no refills LOV 10/01/19 NOV none

## 2020-04-04 ENCOUNTER — Other Ambulatory Visit: Payer: Self-pay | Admitting: Family Medicine

## 2020-05-30 ENCOUNTER — Other Ambulatory Visit: Payer: Self-pay | Admitting: Family Medicine

## 2020-06-26 ENCOUNTER — Other Ambulatory Visit: Payer: Self-pay | Admitting: Family Medicine

## 2020-07-21 ENCOUNTER — Other Ambulatory Visit: Payer: Self-pay

## 2020-07-21 ENCOUNTER — Encounter: Payer: Self-pay | Admitting: Family Medicine

## 2020-07-21 DIAGNOSIS — Z1211 Encounter for screening for malignant neoplasm of colon: Secondary | ICD-10-CM

## 2020-07-21 NOTE — Telephone Encounter (Signed)
Referral placed and patient notified.  

## 2020-07-29 ENCOUNTER — Encounter: Payer: Self-pay | Admitting: Gastroenterology

## 2020-08-08 ENCOUNTER — Other Ambulatory Visit: Payer: Self-pay | Admitting: Family Medicine

## 2020-08-10 ENCOUNTER — Encounter: Payer: Self-pay | Admitting: *Deleted

## 2020-08-22 ENCOUNTER — Other Ambulatory Visit: Payer: Self-pay | Admitting: *Deleted

## 2020-08-22 ENCOUNTER — Other Ambulatory Visit: Payer: Self-pay

## 2020-08-22 ENCOUNTER — Ambulatory Visit (AMBULATORY_SURGERY_CENTER): Payer: BC Managed Care – PPO | Admitting: *Deleted

## 2020-08-22 VITALS — Ht 65.0 in | Wt 245.0 lb

## 2020-08-22 DIAGNOSIS — Z1211 Encounter for screening for malignant neoplasm of colon: Secondary | ICD-10-CM

## 2020-08-22 MED ORDER — CLENPIQ 10-3.5-12 MG-GM -GM/160ML PO SOLN: 160.0000 mL | Freq: Once | ORAL | 0 refills | Status: AC

## 2020-08-22 MED ORDER — CLENPIQ 10-3.5-12 MG-GM -GM/160ML PO SOLN: 160.0000 mL | Freq: Once | ORAL | 0 refills | Status: DC

## 2020-08-22 NOTE — Progress Notes (Signed)
Virtual pre visit completed. Instructions sent through [facility].  No egg or soy allergy known to patient  No issues with past sedation with any surgeries or procedures Patient denies ever being told they had issues or difficulty with intubation  No FH of Malignant Hyperthermia No diet pills per patient No home 02 use per patient  No blood thinners per patient  Pt denies issues with constipation  No A fib or A flutter  EMMI video to pt or via [place] 19 guidelines implemented in [place] today with Pt and RN  Pt is fully vaccinated  for Covid     Due to the COVID-19 pandemic we are asking patients to follow certain guidelines.  Pt aware of COVID protocols and [facility] guidelines

## 2020-08-29 ENCOUNTER — Encounter: Payer: Self-pay | Admitting: Family Medicine

## 2020-09-01 ENCOUNTER — Encounter: Payer: BC Managed Care – PPO | Admitting: Family Medicine

## 2020-09-06 ENCOUNTER — Other Ambulatory Visit: Payer: Self-pay

## 2020-09-06 ENCOUNTER — Ambulatory Visit (AMBULATORY_SURGERY_CENTER): Payer: BC Managed Care – PPO | Admitting: Gastroenterology

## 2020-09-06 ENCOUNTER — Encounter: Payer: Self-pay | Admitting: Gastroenterology

## 2020-09-06 VITALS — BP 154/97 | HR 85 | Temp 99.3°F | Resp 20 | Ht 65.0 in | Wt 245.0 lb

## 2020-09-06 DIAGNOSIS — Z1211 Encounter for screening for malignant neoplasm of colon: Secondary | ICD-10-CM | POA: Diagnosis not present

## 2020-09-06 DIAGNOSIS — K635 Polyp of colon: Secondary | ICD-10-CM

## 2020-09-06 DIAGNOSIS — D124 Benign neoplasm of descending colon: Secondary | ICD-10-CM

## 2020-09-06 DIAGNOSIS — D122 Benign neoplasm of ascending colon: Secondary | ICD-10-CM

## 2020-09-06 MED ORDER — SODIUM CHLORIDE 0.9 % IV SOLN: 500.0000 mL | Freq: Once | INTRAVENOUS | Status: DC

## 2020-09-06 NOTE — Patient Instructions (Signed)
Handout given on polyps.  YOU HAD AN ENDOSCOPIC PROCEDURE TODAY AT [facility]:   Refer to the procedure report that was given to you for any specific questions about what was found during the examination.  If the procedure report does not answer your questions, please call your gastroenterologist to clarify.  If you requested that your care partner not be given the details of your procedure findings, then the procedure report has been included in a sealed envelope for you to review at your convenience later.  YOU SHOULD EXPECT: Some feelings of bloating in the abdomen. Passage of more gas than usual.  Walking can help get rid of the air that was put into your GI tract during the procedure and reduce the bloating. If you had a lower endoscopy (such as a colonoscopy or flexible sigmoidoscopy) you may notice spotting of blood in your stool or on the toilet paper. If you underwent a bowel prep for your procedure, you may not have a normal bowel movement for a few days.  Please Note:  You might notice some irritation and congestion in your nose or some drainage.  This is from the oxygen used during your procedure.  There is no need for concern and it should clear up in a day or so.  SYMPTOMS TO REPORT IMMEDIATELY:  Following lower endoscopy (colonoscopy or flexible sigmoidoscopy):  Excessive amounts of blood in the stool  Significant tenderness or worsening of abdominal pains  Swelling of the abdomen that is new, acute  Fever of 100F or higher   For urgent or emergent issues, a gastroenterologist can be reached at any hour by calling [phone]. Do not use MyChart messaging for urgent concerns.    DIET:  We do recommend a small meal at first, but then you may proceed to your regular diet.  Drink plenty of fluids but you should avoid alcoholic beverages for 24 hours.  ACTIVITY:  You should plan to take it easy for the rest of today and you should NOT DRIVE or use heavy  machinery until tomorrow (because of the sedation medicines used during the test).    FOLLOW UP: Our staff will call the number listed on your records 48-72 hours following your procedure to check on you and address any questions or concerns that you may have regarding the information given to you following your procedure. If we do not reach you, we will leave a message.  We will attempt to reach you two times.  During this call, we will ask if you have developed any symptoms of COVID 19. If you develop any symptoms (ie: fever, flu-like symptoms, shortness of breath, cough etc.) before then, please call [phone].  If you test positive for Covid 19 in the 2 weeks post procedure, please call and report this information to [place].    If any biopsies were taken you will be contacted by phone or by letter within the next 1-3 weeks.  Please call us at [phone] if you have not heard about the biopsies in 3 weeks.    SIGNATURES/CONFIDENTIALITY: You and/or your care partner have signed paperwork which will be entered into your electronic medical record.  These signatures attest to the fact that that the information above on your After Visit Summary has been reviewed and is understood.  Full responsibility of the confidentiality of this discharge information lies with you and/or your care-partner.

## 2020-09-06 NOTE — Progress Notes (Signed)
[facility]  Pt's states no medical or surgical changes since previsit or office visit.   Last period 08/18/2020.  Original temp 100.4.   Taken later and 99.3.  BP 189/104. Not on BP meds.  [facility] notified. Pt is asymptomatic.  Denies covid.  States always has high BP with procedures.  Dr Bryan Lemma spoke with the patient.  OK for procedure per Dr. Loletha Grayer.

## 2020-09-06 NOTE — Op Note (Signed)
[facility] Patient Name: Stacy Ayala Procedure Date: 09/06/2020 1:25 PM MRN: [id number] Endoscopist: Gerrit Heck , MD Age: 45 Referring MD:  Date of Birth: November 03, 1975 Gender: Female Account #: [id number] Procedure:                Colonoscopy Indications:              Screening for colorectal malignant neoplasm, This                            is the patient's first colonoscopy                           Family history notable for cousin with recently                            diagnosed colon cancer as well as colon cancer in                            maternal great grandparent. Otherwise, no current                            GI symptoms. Medicines:                Monitored Anesthesia Care Procedure:                Pre-Anesthesia Assessment:                           - Prior to the procedure, a History and Physical                            was performed, and patient medications and                            allergies were reviewed. The patient's tolerance of                            previous anesthesia was also reviewed. The risks                            and benefits of the procedure and the sedation                            options and risks were discussed with the patient.                            All questions were answered, and informed consent                            was obtained. Prior Anticoagulants: The patient has                            taken no previous anticoagulant or antiplatelet  agents. ASA Grade Assessment: II - A patient with                            mild systemic disease. After reviewing the risks                            and benefits, the patient was deemed in                            satisfactory condition to undergo the procedure.                           After obtaining informed consent, the colonoscope                            was passed under direct vision. Throughout the                             procedure, the patient's blood pressure, pulse, and                            oxygen saturations were monitored continuously. The                            CF [id number] was introduced through the anus                            and advanced to the the terminal ileum. The                            colonoscopy was performed without difficulty. The                            patient tolerated the procedure well. The quality                            of the bowel preparation was good. The terminal                            ileum, ileocecal valve, appendiceal orifice, and                            rectum were photographed. Scope In: 1:40:28 PM Scope Out: 2:04:32 PM Scope Withdrawal Time: 0 hours 20 minutes 10 seconds  Total Procedure Duration: 0 hours 24 minutes 4 seconds  Findings:                 The perianal and digital rectal examinations were                            normal.                           Two sessile polyps were found in the descending  colon (5 mm) and ascending colon (9 mm with                            adherent mucus cap). These polyps were removed with                            a cold snare. Resection and retrieval were                            complete. Estimated blood loss was minimal.                           The exam was otherwise normal throughout the                            remainder of the colon.                           The retroflexed view of the distal rectum and anal                            verge was normal and showed no anal or rectal                            abnormalities.                           The terminal ileum appeared normal. Complications:            No immediate complications. Estimated Blood Loss:     Estimated blood loss was minimal. Impression:               - Two 5 to 9 mm polyps in the descending colon and                            in the ascending colon, removed with a cold snare.                             Resected and retrieved.                           - The distal rectum and anal verge are normal on                            retroflexion view.                           - The examined portion of the ileum was normal. Recommendation:           - Patient has a contact number available for                            emergencies. The signs and symptoms of potential  delayed complications were discussed with the                            patient. Return to normal activities tomorrow.                            Written discharge instructions were provided to the                            patient.                           - Resume previous diet.                           - Continue present medications.                           - Await pathology results.                           - Repeat colonoscopy for surveillance based on                            pathology results.                           - Return to GI office PRN. Gerrit Heck, MD 09/06/2020 2:10:53 PM

## 2020-09-06 NOTE — Progress Notes (Signed)
Called to procedure room to assist MD. Name and DOB confirmed.

## 2020-09-06 NOTE — Progress Notes (Signed)
To pacu, VSS. Report to RN.tb 

## 2020-09-07 ENCOUNTER — Encounter: Payer: Self-pay | Admitting: Registered Nurse

## 2020-09-07 ENCOUNTER — Ambulatory Visit: Payer: BC Managed Care – PPO | Admitting: Registered Nurse

## 2020-09-07 VITALS — BP 126/80 | HR 99 | Temp 99.3°F | Resp 18 | Ht 65.0 in | Wt 244.2 lb

## 2020-09-07 DIAGNOSIS — R030 Elevated blood-pressure reading, without diagnosis of hypertension: Secondary | ICD-10-CM

## 2020-09-07 DIAGNOSIS — B9689 Other specified bacterial agents as the cause of diseases classified elsewhere: Secondary | ICD-10-CM | POA: Insufficient documentation

## 2020-09-07 DIAGNOSIS — N760 Acute vaginitis: Secondary | ICD-10-CM | POA: Insufficient documentation

## 2020-09-07 HISTORY — DX: Other specified bacterial agents as the cause of diseases classified elsewhere: B96.89

## 2020-09-07 NOTE — Patient Instructions (Signed)
[name] -   Blood pressure great on recheck. 126/80  No intervention needed  Keep a healthy diet and stay active and we can ensure it does not become an issue for some time.  We'll see you in October  Let me know if you need anything in the mean time  Thanks,  Stacy Ayala

## 2020-09-07 NOTE — Progress Notes (Signed)
Established Patient Office Visit  Subjective:  Patient ID: Stacy Ayala, female    DOB: 1975-07-19  Age: 45 y.o. MRN: [id number]  CC:  Chief Complaint  Patient presents with   Hypertension    HPI Anneisha Munzer presents for BP  Noted elevated BP yesterday at colonoscopy  Elevated to 150-160/90s.   No symptoms  Review of past BP good  No other concerns.   Past Medical History:  Diagnosis Date   Acne    Anxiety    Depression     Past Surgical History:  Procedure Laterality Date   CESAREAN SECTION      Family History  Problem Relation Age of Onset   Hypertension Mother    Heart attack Mother    Colon cancer Other    Breast cancer Other        maternal great aunts   Colon polyps Neg Hx    Esophageal cancer Neg Hx    Rectal cancer Neg Hx    Stomach cancer Neg Hx     Social History   Socioeconomic History   Marital status: Married    Spouse name: Not on file   Number of children: Not on file   Years of education: Not on file   Highest education level: Not on file  Occupational History   Not on file  Tobacco Use   Smoking status: Never   Smokeless tobacco: Never  Substance and Sexual Activity   Alcohol use: Yes    Comment: occ.   Drug use: No   Sexual activity: Not on file  Other Topics Concern   Not on file  Social History Narrative   Teacher at [employer], originally from [place], [place].   Husband went to [employer].         Social Determinants of Health   Financial Resource Strain: Not on file  Food Insecurity: Not on file  Transportation Needs: Not on file  Physical Activity: Not on file  Stress: Not on file  Social Connections: Not on file  Intimate Partner Violence: Not on file    Outpatient Medications Prior to Visit  Medication Sig Dispense Refill   Ascorbic Acid (VITAMIN C PO) Take by mouth.     buPROPion (WELLBUTRIN XL) 300 MG 24 hr tablet Take 1 tablet by mouth once daily 30 tablet 0   clonazePAM (KLONOPIN) 0.5 MG tablet TAKE 1 TABLET BY  MOUTH ONCE DAILY AT BEDTIME FOR ANXIETY 30 tablet 0   Multiple Vitamin (MULTIVITAMIN) tablet Take 1 tablet by mouth daily.       Vitamin D, Cholecalciferol, 50 MCG (2000 UT) CAPS Take by mouth.     No facility-administered medications prior to visit.    Allergies  Allergen Reactions   Bactrim [Sulfamethoxazole-Trimethoprim] Rash    ROS Review of Systems  Constitutional: Negative.   HENT: Negative.    Eyes: Negative.   Respiratory: Negative.    Cardiovascular: Negative.   Gastrointestinal: Negative.   Genitourinary: Negative.   Musculoskeletal: Negative.   Skin: Negative.   Neurological: Negative.   Psychiatric/Behavioral: Negative.    All other systems reviewed and are negative.    Objective:    Physical Exam Vitals and nursing note reviewed.  Constitutional:      General: She is not in acute distress.    Appearance: Normal appearance. She is normal weight. She is not ill-appearing, toxic-appearing or diaphoretic.  Cardiovascular:     Rate and Rhythm: Normal rate and regular rhythm.     Heart  sounds: Normal heart sounds. No murmur heard.   No friction rub. No gallop.  Pulmonary:     Effort: Pulmonary effort is normal. No respiratory distress.     Breath sounds: Normal breath sounds. No stridor. No wheezing, rhonchi or rales.  Chest:     Chest wall: No tenderness.  Skin:    General: Skin is warm and dry.  Neurological:     General: No focal deficit present.     Mental Status: She is alert and oriented to person, place, and time. Mental status is at baseline.  Psychiatric:        Mood and Affect: Mood normal.        Behavior: Behavior normal.        Thought Content: Thought content normal.        Judgment: Judgment normal.    BP 126/80 (BP Location: Left Arm, Patient Position: Sitting, Cuff Size: Large)   Pulse 99   Temp 99.3 F (37.4 C) (Temporal)   Resp 18   Ht '5\' 5"'$  (1.651 m)   Wt 244 lb 3.2 oz (110.8 kg)   LMP 08/17/2020   SpO2 96%   BMI 40.64 kg/m   Wt Readings from Last 3 Encounters:  09/07/20 244 lb 3.2 oz (110.8 kg)  09/06/20 245 lb (111.1 kg)  08/22/20 245 lb (111.1 kg)     There are no preventive care reminders to display for this patient.  There are no preventive care reminders to display for this patient.  Lab Results  Component Value Date   TSH 2.25 08/31/2019   Lab Results  Component Value Date   WBC 9.0 08/31/2019   HGB 14.1 08/31/2019   HCT 40.9 08/31/2019   MCV 87.8 08/31/2019   PLT 285.0 08/31/2019   Lab Results  Component Value Date   NA 136 08/31/2019   K 4.0 08/31/2019   CO2 23 08/31/2019   GLUCOSE 80 08/31/2019   BUN 10 08/31/2019   CREATININE 0.67 08/31/2019   BILITOT 0.4 08/31/2019   ALKPHOS 81 08/31/2019   AST 21 08/31/2019   ALT 19 08/31/2019   PROT 7.4 08/31/2019   ALBUMIN 4.3 08/31/2019   CALCIUM 9.2 08/31/2019   GFR 95.45 08/31/2019   Lab Results  Component Value Date   CHOL 176 08/31/2019   Lab Results  Component Value Date   HDL 42.50 08/31/2019   Lab Results  Component Value Date   LDLCALC 105 (H) 08/31/2019   Lab Results  Component Value Date   TRIG 138.0 08/31/2019   Lab Results  Component Value Date   CHOLHDL 4 08/31/2019   No results found for: HGBA1C    Assessment & Plan:   Problem List Items Addressed This Visit   None Visit Diagnoses     Elevated blood pressure reading without diagnosis of hypertension    -  Primary       No orders of the defined types were placed in this encounter.   Follow-up: Return if symptoms worsen or fail to improve, for as scheduled for CPE in Oct.   PLAN BP elevated on arrival but back to normal range on manual recheck  Reassured patient no intervention needed Return for scheduled CPE in October, will delay labs until then Patient encouraged to call clinic with any questions, comments, or concerns.   Maximiano Coss, NP

## 2020-09-08 ENCOUNTER — Telehealth: Payer: Self-pay

## 2020-09-08 NOTE — Telephone Encounter (Signed)
  Follow up Call-  Call back number 09/06/2020  Post procedure Call Back phone  # [phone]  Permission to leave phone message Yes  Some recent data might be hidden     Patient questions:  Do you have a fever, pain , or abdominal swelling? No. Pain Score  0 *  Have you tolerated food without any problems? Yes.    Have you been able to return to your normal activities? Yes.    Do you have any questions about your discharge instructions: Diet   No. Medications  No. Follow up visit  No.  Do you have questions or concerns about your Care? No.  Actions: * If pain score is 4 or above: No action needed, pain <4.  Have you developed a fever since your procedure? no  2.   Have you had an respiratory symptoms (SOB or cough) since your procedure? no  3.   Have you tested positive for COVID 19 since your procedure no  4.   Have you had any family members/close contacts diagnosed with the COVID 19 since your procedure?  no   If yes to any of these questions please route to Joylene John, RN and Joella Prince, RN

## 2020-09-13 ENCOUNTER — Other Ambulatory Visit: Payer: Self-pay | Admitting: Family Medicine

## 2020-09-16 ENCOUNTER — Encounter: Payer: Self-pay | Admitting: Gastroenterology

## 2020-10-21 ENCOUNTER — Other Ambulatory Visit: Payer: Self-pay | Admitting: Family Medicine

## 2020-11-29 ENCOUNTER — Other Ambulatory Visit: Payer: Self-pay | Admitting: Family Medicine

## 2020-12-09 ENCOUNTER — Encounter: Payer: Self-pay | Admitting: Family Medicine

## 2020-12-09 ENCOUNTER — Other Ambulatory Visit: Payer: Self-pay

## 2020-12-09 ENCOUNTER — Ambulatory Visit (INDEPENDENT_AMBULATORY_CARE_PROVIDER_SITE_OTHER): Payer: BC Managed Care – PPO | Admitting: Family Medicine

## 2020-12-09 VITALS — BP 118/81 | HR 88 | Temp 98.4°F | Resp 17 | Ht 64.5 in | Wt 241.8 lb

## 2020-12-09 DIAGNOSIS — F32A Depression, unspecified: Secondary | ICD-10-CM

## 2020-12-09 DIAGNOSIS — E559 Vitamin D deficiency, unspecified: Secondary | ICD-10-CM

## 2020-12-09 DIAGNOSIS — F419 Anxiety disorder, unspecified: Secondary | ICD-10-CM

## 2020-12-09 DIAGNOSIS — E66813 Obesity, class 3: Secondary | ICD-10-CM

## 2020-12-09 DIAGNOSIS — Z0000 Encounter for general adult medical examination without abnormal findings: Secondary | ICD-10-CM

## 2020-12-09 LAB — LIPID PANEL
Cholesterol: 157 mg/dL (ref 0–200)
HDL: 40.4 mg/dL (ref >39.00)
LDL Cholesterol: 99 mg/dL (ref 0–99)
NonHDL: 116.59
Total CHOL/HDL Ratio: 4
Triglycerides: 89 mg/dL (ref 0.0–149.0)
VLDL: 17.8 mg/dL (ref 0.0–40.0)

## 2020-12-09 LAB — CBC WITH DIFFERENTIAL/PLATELET
Basophils Absolute: 0 10*3/uL (ref 0.0–0.1)
Basophils Relative: 0.7 % (ref 0.0–3.0)
Eosinophils Absolute: 0.1 10*3/uL (ref 0.0–0.7)
Eosinophils Relative: 1.4 % (ref 0.0–5.0)
HCT: 40.7 % (ref 36.0–46.0)
Hemoglobin: 13.6 g/dL (ref 12.0–15.0)
Lymphocytes Relative: 27.4 % (ref 12.0–46.0)
Lymphs Abs: 1.4 10*3/uL (ref 0.7–4.0)
MCHC: 33.5 g/dL (ref 30.0–36.0)
MCV: 88.8 fl (ref 78.0–100.0)
Monocytes Absolute: 0.4 10*3/uL (ref 0.1–1.0)
Monocytes Relative: 7.1 % (ref 3.0–12.0)
Neutro Abs: 3.3 10*3/uL (ref 1.4–7.7)
Neutrophils Relative %: 63.4 % (ref 43.0–77.0)
Platelets: 267 10*3/uL (ref 150.0–400.0)
RBC: 4.58 Mil/uL (ref 3.87–5.11)
RDW: 12.3 % (ref 11.5–15.5)
WBC: 5.2 10*3/uL (ref 4.0–10.5)

## 2020-12-09 LAB — HEPATIC FUNCTION PANEL
ALT: 14 U/L (ref 0–35)
AST: 14 U/L (ref 0–37)
Albumin: 4.2 g/dL (ref 3.5–5.2)
Alkaline Phosphatase: 58 U/L (ref 39–117)
Bilirubin, Direct: 0.1 mg/dL (ref 0.0–0.3)
Total Bilirubin: 0.4 mg/dL (ref 0.2–1.2)
Total Protein: 7.3 g/dL (ref 6.0–8.3)

## 2020-12-09 LAB — BASIC METABOLIC PANEL
BUN: 14 mg/dL (ref 6–23)
CO2: 27 mEq/L (ref 19–32)
Calcium: 9.1 mg/dL (ref 8.4–10.5)
Chloride: 104 mEq/L (ref 96–112)
Creatinine, Ser: 0.82 mg/dL (ref 0.40–1.20)
GFR: 86.25 mL/min (ref >60.00)
Glucose, Bld: 88 mg/dL (ref 70–99)
Potassium: 4.6 mEq/L (ref 3.5–5.1)
Sodium: 139 mEq/L (ref 135–145)

## 2020-12-09 LAB — VITAMIN D 25 HYDROXY (VIT D DEFICIENCY, FRACTURES): VITD: 43.47 ng/mL (ref 30.00–100.00)

## 2020-12-09 LAB — TSH: TSH: 2.87 u[IU]/mL (ref 0.35–5.50)

## 2020-12-09 MED ORDER — BUPROPION HCL ER (XL) 300 MG PO TB24: 300.0000 mg | ORAL_TABLET | Freq: Every day | ORAL | 3 refills | Status: DC

## 2020-12-09 NOTE — Progress Notes (Signed)
   Subjective:    Patient ID: Stacy Ayala, female    DOB: [birth year], 44 y.o.   MRN: [id number]  HPI CPE- UTD on pap, mammo, colonoscopy.  UTD on Tdap, flu, COVID.  Health Maintenance  Topic Date Due   Hepatitis C Screening  09/07/2021 (Originally 04/17/1993)   HIV Screening  09/07/2021 (Originally 04/18/1990)   COVID-19 Vaccine (5 - Booster for Pfizer series) 12/23/2020   MAMMOGRAM  09/07/2021   PAP SMEAR-Modifier  08/28/2022   TETANUS/TDAP  10/24/2023   COLONOSCOPY (Pts 45-21yrs Insurance coverage will need to be confirmed)  09/06/2025   INFLUENZA VACCINE  Completed   Pneumococcal Vaccine 3-84 Years old  Aged Out   HPV VACCINES  Aged Out      Review of Systems Patient reports no vision/ hearing changes, adenopathy,fever, weight change,  persistant/recurrent hoarseness , swallowing issues, chest pain, palpitations, edema, persistant/recurrent cough, hemoptysis, dyspnea (rest/exertional/paroxysmal nocturnal), gastrointestinal bleeding (melena, rectal bleeding), abdominal pain, significant heartburn, bowel changes, GU symptoms (dysuria, hematuria, incontinence), Gyn symptoms (abnormal  bleeding, pain),  syncope, focal weakness, memory loss, numbness & tingling, skin/hair/nail changes, abnormal bruising or bleeding.  + anxiety/depression- 'i don't want to go do anything.  It's too much work.  I'm exhausted'.   This visit occurred during the SARS-CoV-2 public health emergency.  Safety protocols were in place, including screening questions prior to the visit, additional usage of staff PPE, and extensive cleaning of exam room while observing appropriate contact time as indicated for disinfecting solutions.      Objective:   Physical Exam General Appearance:    Alert, cooperative, no distress, appears stated age, obese  Head:    Normocephalic, without obvious abnormality, atraumatic  Eyes:    PERRL, conjunctiva/corneas clear, EOM's intact, fundi    benign, both eyes  Ears:    Normal TM's and  external ear canals, both ears  Nose:   Deferred due to COVID  Throat:   Neck:   Supple, symmetrical, trachea midline, no adenopathy;    Thyroid: no enlargement/tenderness/nodules  Back:     Symmetric, no curvature, ROM normal, no CVA tenderness  Lungs:     Clear to auscultation bilaterally, respirations unlabored  Chest Wall:    No tenderness or deformity   Heart:    Regular rate and rhythm, S1 and S2 normal, no murmur, rub   or gallop  Breast Exam:    Deferred to GYN  Abdomen:     Soft, non-tender, bowel sounds active all four quadrants,    no masses, no organomegaly  Genitalia:    Deferred to GYN  Rectal:    Extremities:   Extremities normal, atraumatic, no cyanosis or edema  Pulses:   2+ and symmetric all extremities  Skin:   Skin color, texture, turgor normal, no rashes or lesions  Lymph nodes:   Cervical, supraclavicular, and axillary nodes normal  Neurologic:   CNII-XII intact, normal strength, sensation and reflexes    throughout          Assessment & Plan:

## 2020-12-09 NOTE — Assessment & Plan Note (Signed)
Deteriorated but pt realized she has been off her thyroid medication since spring.  Suspect this has something to do w/ her lack of motivation and energy.  Will follow.

## 2020-12-09 NOTE — Assessment & Plan Note (Signed)
Pt's PE WNL w/ exception of obesity and unchanged from previous.  UTD on pap, mammo, colonoscopy, immunizations.  Check labs.  Anticipatory guidance provided.

## 2020-12-09 NOTE — Assessment & Plan Note (Signed)
Check labs and replete prn. 

## 2020-12-09 NOTE — Patient Instructions (Signed)
Follow up in 1 year or as needed We'll notify you of your lab results and make any changes if needed Continue to work on healthy diet and regular exercise- you can do it! Call with any questions or concerns Stay Safe!  Stay Healthy! Happy Fall!! 

## 2020-12-09 NOTE — Assessment & Plan Note (Signed)
Ongoing issue for pt.  Stressed the need for healthy diet and regular exercise.  Check labs to risk stratify.  Will follow.

## 2021-04-10 ENCOUNTER — Telehealth: Payer: Self-pay | Admitting: Family Medicine

## 2021-04-10 ENCOUNTER — Encounter (HOSPITAL_BASED_OUTPATIENT_CLINIC_OR_DEPARTMENT_OTHER): Payer: Self-pay

## 2021-04-10 ENCOUNTER — Emergency Department (HOSPITAL_BASED_OUTPATIENT_CLINIC_OR_DEPARTMENT_OTHER): Payer: BC Managed Care – PPO

## 2021-04-10 ENCOUNTER — Other Ambulatory Visit: Payer: Self-pay

## 2021-04-10 ENCOUNTER — Emergency Department (HOSPITAL_BASED_OUTPATIENT_CLINIC_OR_DEPARTMENT_OTHER)
Admission: EM | Admit: 2021-04-10 | Discharge: 2021-04-10 | Disposition: A | Discharge status: Home / Self Care | Payer: BC Managed Care – PPO | Attending: Emergency Medicine | Admitting: Emergency Medicine

## 2021-04-10 DIAGNOSIS — R000 Tachycardia, unspecified: Secondary | ICD-10-CM | POA: Diagnosis not present

## 2021-04-10 DIAGNOSIS — R030 Elevated blood-pressure reading, without diagnosis of hypertension: Secondary | ICD-10-CM | POA: Diagnosis not present

## 2021-04-10 DIAGNOSIS — R519 Headache, unspecified: Secondary | ICD-10-CM | POA: Diagnosis not present

## 2021-04-10 DIAGNOSIS — R072 Precordial pain: Secondary | ICD-10-CM | POA: Diagnosis present

## 2021-04-10 DIAGNOSIS — R079 Chest pain, unspecified: Secondary | ICD-10-CM

## 2021-04-10 DIAGNOSIS — E039 Hypothyroidism, unspecified: Secondary | ICD-10-CM | POA: Diagnosis not present

## 2021-04-10 LAB — BASIC METABOLIC PANEL
Anion gap: 7 (ref 5–15)
BUN: 10 mg/dL (ref 6–20)
CO2: 23 mmol/L (ref 22–32)
Calcium: 9.3 mg/dL (ref 8.9–10.3)
Chloride: 106 mmol/L (ref 98–111)
Creatinine, Ser: 0.78 mg/dL (ref 0.44–1.00)
GFR, Estimated: >60 mL/min (ref >60)
Glucose, Bld: 105 mg/dL — ABNORMAL HIGH (ref 70–99)
Potassium: 4 mmol/L (ref 3.5–5.1)
Sodium: 136 mmol/L (ref 135–145)

## 2021-04-10 LAB — CBC
HCT: 39.3 % (ref 36.0–46.0)
Hemoglobin: 13.3 g/dL (ref 12.0–15.0)
MCH: 29.6 pg (ref 26.0–34.0)
MCHC: 33.8 g/dL (ref 30.0–36.0)
MCV: 87.5 fL (ref 80.0–100.0)
Platelets: 286 10*3/uL (ref 150–400)
RBC: 4.49 MIL/uL (ref 3.87–5.11)
RDW: 12.4 % (ref 11.5–15.5)
WBC: 7.1 10*3/uL (ref 4.0–10.5)
nRBC: 0 % (ref 0.0–0.2)

## 2021-04-10 LAB — TROPONIN I (HIGH SENSITIVITY)
Troponin I (High Sensitivity): <2 ng/L (ref <18)
Troponin I (High Sensitivity): 2 ng/L (ref <18)

## 2021-04-10 LAB — PREGNANCY, URINE: Preg Test, Ur: NEGATIVE

## 2021-04-10 MED ORDER — ASPIRIN 81 MG PO CHEW
324.0000 mg | CHEWABLE_TABLET | Freq: Once | ORAL | Status: AC
  Administered 2021-04-10: 324 mg via ORAL
  Filled 2021-04-10: qty 4

## 2021-04-10 MED ORDER — NITROGLYCERIN 0.4 MG SL SUBL
0.4000 mg | SUBLINGUAL_TABLET | SUBLINGUAL | Status: DC | PRN
  Administered 2021-04-10: 0.4 mg via SUBLINGUAL
  Filled 2021-04-10: qty 1

## 2021-04-10 NOTE — Discharge Instructions (Signed)
Follow-up with your primary care provider.  Return to the emergency room for any worsening or concerning symptoms.  Consider follow-up with cardiology, referral given. Monitor your blood pressure at home, record your readings and take to your primary care for follow-up.

## 2021-04-10 NOTE — Telephone Encounter (Signed)
Noted  

## 2021-04-10 NOTE — ED Notes (Signed)
ED Provider at bedside. 

## 2021-04-10 NOTE — ED Notes (Signed)
Patient transported to CT 

## 2021-04-10 NOTE — ED Provider Notes (Signed)
[facility]ERGENCY DEPARTMENT Provider Note   CSN: [id number] Arrival date & time: 04/10/21  1509     History  Chief Complaint  Patient presents with   Chest Pain    Stacy Ayala is a 46 y.o. female.  46 year old female presents with complaint of headache and chest pain. Reports her mother died in [place] on 2022-05-13, patient drove overnight with her husband to [facility] and arrived early Saturday Morning. Developed a headache that is intermittent and varies in location. Today noticed midsternal chest tightness that is intermittent, lasting up to 30 minutes at a time, not associated with diaphoresis, nausea, vomiting. Denies unilateral weakness or numbness. Patient checked her BP at home, it was elevated, went to [facility] and was sent to the ER. States her BP is normally 1180/80s at her PCP office, but does go very high when she goes to the dr for pap smears and colonoscopies. Denies history of HTN, hyperlipidemia, DM, is a non smoker.       Home Medications Prior to Admission medications   Medication Sig Start Date End Date Taking? Authorizing Provider  buPROPion (WELLBUTRIN XL) 300 MG 24 hr tablet Take 1 tablet (300 mg total) by mouth daily. 12/09/20  Yes Midge Minium, MD  Multiple Vitamin (MULTIVITAMIN) tablet Take 1 tablet by mouth daily.     Yes [provider]  Vitamin D, Cholecalciferol, 50 MCG (2000 UT) CAPS Take by mouth.   Yes [provider]  Ascorbic Acid (VITAMIN C PO) Take by mouth.    [provider]  Ocie Bob AG HOME TEST KIT Use as Directed on the Package 11/30/20   [provider]      Allergies    Bactrim [sulfamethoxazole-trimethoprim]    Review of Systems   Review of Systems Negative except as per HPI Physical Exam Updated Vital Signs BP (!) 141/88 (BP Location: Left Arm)    Pulse 84    Temp 98.3 F (36.8 C) (Oral)    Resp 15    Ht '5\' 5"'  (1.651 m)    Wt 111.6 kg    LMP 03/21/2021    SpO2 100%    BMI 40.94 kg/m   Physical Exam Vitals and nursing note reviewed.  Constitutional:      General: She is not in acute distress.    Appearance: She is obese. She is not ill-appearing, toxic-appearing or diaphoretic.  HENT:     Head: Normocephalic and atraumatic.  Cardiovascular:     Rate and Rhythm: Regular rhythm. Tachycardia present.     Heart sounds: Normal heart sounds. No murmur heard. Pulmonary:     Effort: Pulmonary effort is normal.     Breath sounds: Normal breath sounds.  Chest:     Chest wall: No tenderness.  Abdominal:     Palpations: Abdomen is soft.     Tenderness: There is no abdominal tenderness.  Musculoskeletal:     Right lower leg: No tenderness. No edema.     Left lower leg: No tenderness. No edema.  Skin:    General: Skin is warm and dry.     Findings: No erythema or rash.  Neurological:     Mental Status: She is alert and oriented to person, place, and time.  Psychiatric:        Behavior: Behavior normal.    ED Results / Procedures / Treatments   Labs (all labs ordered are listed, but only abnormal results are displayed) Labs Reviewed  BASIC METABOLIC PANEL -  Abnormal; Notable for the following components:      Result Value   Glucose, Bld 105 (*)    All other components within normal limits  CBC  PREGNANCY, URINE  TROPONIN I (HIGH SENSITIVITY)  TROPONIN I (HIGH SENSITIVITY)    EKG EKG Interpretation  Date/Time:  Monday April 10 2021 15:26:04 EST Ventricular Rate:  109 PR Interval:  186 QRS Duration: 82 QT Interval:  314 QTC Calculation: 422 R Axis:   76 Text Interpretation: Sinus tachycardia Otherwise normal ECG No previous ECGs available Confirmed by Madalyn Rob [phone]) on 04/10/2021 5:04:31 PM  Radiology DG Chest 2 View  Result Date: 04/10/2021 CLINICAL DATA:  Provided history: Chest pain. Additional history provided: Headache since Friday. EXAM: CHEST - 2 VIEW COMPARISON:  Prior chest radiographs 04/22/2009 FINDINGS: Heart size within normal  limits. No appreciable airspace consolidation or pulmonary edema. No evidence of pleural effusion or pneumothorax. No acute bony abnormality identified. IMPRESSION: No evidence of active cardiopulmonary disease. Electronically Signed   By: Kellie Simmering D.O.   On: 04/10/2021 16:02   CT Head Wo Contrast  Result Date: 04/10/2021 CLINICAL DATA:  Headache EXAM: CT HEAD WITHOUT CONTRAST TECHNIQUE: Contiguous axial images were obtained from the base of the skull through the vertex without intravenous contrast. RADIATION DOSE REDUCTION: This exam was performed according to the departmental dose-optimization program which includes automated exposure control, adjustment of the mA and/or kV according to patient size and/or use of iterative reconstruction technique. COMPARISON:  None. FINDINGS: Brain: No acute intracranial hemorrhage, mass effect, or herniation. No extra-axial fluid collections. No evidence of acute territorial infarct. No hydrocephalus. Vascular: No hyperdense vessel or unexpected calcification. Skull: Normal. Negative for fracture or focal lesion. Sinuses/Orbits: No acute finding. Other: None. IMPRESSION: No acute intracranial process identified. Electronically Signed   By: Ofilia Neas M.D.   On: 04/10/2021 16:43    Procedures Procedures    Medications Ordered in ED Medications  nitroGLYCERIN (NITROSTAT) SL tablet 0.4 mg (0.4 mg Sublingual Given 04/10/21 1643)  aspirin chewable tablet 324 mg (324 mg Oral Given 04/10/21 1641)    ED Course/ Medical Decision Making/ A&P                           Medical Decision Making Amount and/or Complexity of Data Reviewed Labs: ordered. Radiology: ordered.  Risk OTC drugs. Prescription drug management.   This patient presents to the ED for concern of elevated blood pressure with headache and chest tightness, this involves an extensive number of treatment options, and is a complaint that carries with it a high risk of complications and  morbidity.  The differential diagnosis includes but not limited to hypertensive emergency, ACS, MI, NSTEMI, CVA   Co morbidities that complicate the patient evaluation  Hypothyroid, anxiety, depression, obesity   Additional history obtained:  Additional history obtained from husband at bedside External records from outside source obtained and reviewed including fall call to PCP office today regarding patient's symptoms with plan to follow-up in office on Friday.   Lab Tests:  I Ordered, and personally interpreted labs.  The pertinent results include: Troponin less than 2, CBC is unremarkable, BMP without significant findings.  Repeat troponin is without significant change at 2   Imaging Studies ordered:  I ordered imaging studies including CXR  I independently visualized and interpreted imaging which showed unremarkable  I agree with the radiologist interpretation CT head for headache with hypertension, unremarkable   Cardiac Monitoring:  The patient was maintained on a cardiac monitor.  I personally viewed and interpreted the cardiac monitored which showed an underlying rhythm of: sinus rhythm    Medicines ordered and prescription drug management:  I ordered medication including ASA and nitro  for CP and HTN  Reevaluation of the patient after these medicines showed that the patient improved I have reviewed the patients home medicines and have made adjustments as needed   Critical Interventions:  Aspirin and nitro for hypertension with chest pain and headache  Problem List / ED Course:  46 year old female presents with complaint of headache with mid chest tightness and elevated blood pressure.  Patient had gone to urgent care for same, blood pressure was elevated was directed to the emergency room for further evaluation.  Arrives emergency room with blood pressure as high as 201/117, was given nitro and aspirin with improvement in her blood pressure.  Also tachycardic on  arrival with heart rate of 111, this is also improved.  Initial labs and troponin are reassuring.  EKG without ischemic changes.  Plan is to repeat troponin and and can discharge to follow-up with PCP/cardiology if not significantly changed.  Patient seen by Dr. Roslynn Amble, ER attending, agrees with plan of care.   Reevaluation:  After the interventions noted above, I reevaluated the patient and found that they have :improved   Dispostion:  After consideration of the diagnostic results and the patients response to treatment, I feel that the patent would benefit from follow-up with PCP/cardiology if repeat troponin is without significant change and symptoms improved.          Final Clinical Impression(s) / ED Diagnoses Final diagnoses:  Nonspecific chest pain  Elevated blood pressure reading    Rx / DC Orders ED Discharge Orders     None         Roque Lias 04/10/21 1907    Lucrezia Starch, MD 04/10/21 [phone]

## 2021-04-10 NOTE — ED Triage Notes (Signed)
Pt c/o CP x today-also c/o that started 2/19-states she has been under a lot of stress r/t to death in the family/travel-NAD-steady gait

## 2021-04-10 NOTE — Telephone Encounter (Signed)
Chief Complaint Headache Reason for Call Symptomatic / Request for Health Information Initial Comment Caller states she has a high blood pressure 173/109 and a headache. She took some medicine for the headache and it's feeling better. The call was transferred from the office because they currently have no available appointments. Translation No Nurse Assessment Nurse: Kirk Ruths, RN, [facility] Date/Time (Eastern Time): 04/10/2021 1:13:06 PM Confirm and document reason for call. If symptomatic, describe symptoms. ---Caller states she has b/p of 173/138 no cp or shortness has slight headache that has resolved some since taking meds. Has been under stress from mother passing away this past weekend has hx of anxiety has been told at specialist offices and told her pressure is scary high Does the patient have any new or worsening symptoms? ---Yes Will a triage be completed? ---Yes Related visit to physician within the last 2 weeks? ---No Does the PT have any chronic conditions? (i.e. diabetes, asthma, this includes High risk factors for pregnancy, etc.) ---Yes List chronic conditions. ---anxiety depression Is the patient pregnant or possibly pregnant? (Ask all females between the ages of 19-55) ---No Is this a behavioral health or substance abuse call? ---No PLEASE NOTE: All timestamps contained within this report are represented as [place] Standard Time. CONFIDENTIALTY NOTICE: This fax transmission is intended only for the addressee. It contains information that is legally privileged, confidential or otherwise protected from use or disclosure. If you are not the intended recipient, you are strictly prohibited from reviewing, disclosing, copying using or disseminating any of this information or taking any action in reliance on or regarding this information. If you have received this fax in error, please notify us immediately by telephone so that we can arrange for its return to [place]. Phone:  [phone], Toll-Free: [phone], Fax: [phone] Page: 2 of 2 Call Id: [id number] Guidelines Guideline Title Affirmed Question Affirmed Notes Nurse Date/Time Eilene Ghazi Time) Blood Pressure - High [7] Systolic BP >= 322 OR Diastolic >= 025 AND [4] cardiac or neurologic symptoms (e.g., chest pain, difficulty breathing, unsteady gait, blurred vision) Kirk Ruths, RN, [facility] 04/10/2021 1:15:29 PM Disp. Time Eilene Ghazi Time) Disposition Final User 04/10/2021 1:16:52 PM Go to ED Now Yes Kirk Ruths, RN, [facility] Caller Disagree/Comply Comply Caller Understands Yes PreDisposition Go to ED Care Advice Given Per Guideline GO TO ED NOW: * You need to be seen in the Emergency Department. * Go to the ED at ___________ [place] now. Drive carefully. NOTE TO TRIAGER - DRIVING: * Another adult should drive. * Patient should not delay going to the emergency department. * If immediate transportation is not available via car, rideshare (e.g., Lyft, Uber), or taxi, then the patient should be instructed to call [id number]. CALL EMS 911 IF: * Passes out or faints * Becomes confused * Becomes too weak to stand * You become worse CARE ADVICE given per High Blood Pressure (Adult) guideline. Referrals [facility] - ED

## 2021-04-10 NOTE — Telephone Encounter (Signed)
Spoke to patient, she states that she went to the urgent care near her house, she went ahead and set up an appt for Friday. She will keep or reschedule depending on what they say at urgent care. She said she will follow up and let us know what they say

## 2021-04-11 ENCOUNTER — Encounter: Payer: Self-pay | Admitting: Family Medicine

## 2021-04-11 ENCOUNTER — Ambulatory Visit: Payer: BC Managed Care – PPO | Admitting: Family Medicine

## 2021-04-11 VITALS — BP 172/112 | HR 108 | Temp 98.9°F | Wt 244.8 lb

## 2021-04-11 DIAGNOSIS — F419 Anxiety disorder, unspecified: Secondary | ICD-10-CM | POA: Diagnosis not present

## 2021-04-11 DIAGNOSIS — I10 Essential (primary) hypertension: Secondary | ICD-10-CM | POA: Insufficient documentation

## 2021-04-11 DIAGNOSIS — F32A Depression, unspecified: Secondary | ICD-10-CM

## 2021-04-11 HISTORY — DX: Essential (primary) hypertension: I10

## 2021-04-11 MED ORDER — METOPROLOL SUCCINATE ER 25 MG PO TB24: 25.0000 mg | ORAL_TABLET | Freq: Every day | ORAL | 3 refills | Status: DC

## 2021-04-11 MED ORDER — ALPRAZOLAM 0.5 MG PO TABS: 0.5000 mg | ORAL_TABLET | Freq: Three times a day (TID) | ORAL | 1 refills | Status: DC | PRN

## 2021-04-11 NOTE — Assessment & Plan Note (Signed)
Deteriorated.  Pt's mom died unexpectedly this weekend after suffering a heart attack while on a bike ride.  She is understandably devastated.  Will add Alprazolam to use for panicked moments.  Will also refer to counseling to process all of this.  The addition of a beta blocker for her blood pressure should also help somewhat.  Pt expressed understanding and is in agreement w/ plan.

## 2021-04-11 NOTE — Assessment & Plan Note (Signed)
New.  Pt has had elevated BP readings at GYN and other speciality offices where she gets nervous and they use and automatic cuff.  BP has always been ok here.  BP is now very high and she is symptomatic w/ a headache.  Given her hx of elevated readings, will start Metoprolol to improve both BP, HR, and anxiety.  Will monitor closely and adjust/add meds prn.  Pt expressed understanding and is in agreement w/ plan.

## 2021-04-11 NOTE — Assessment & Plan Note (Signed)
Ongoing issue for pt.  Given mom's heart attack she would like to go to a medical weight loss office to reduce her risk.  Referral placed.

## 2021-04-11 NOTE — Progress Notes (Signed)
° °  Subjective:    Patient ID: Stacy Ayala, female    DOB: Jun 25, 1975, 46 y.o.   MRN: [id number]  HPI Elevated BP- pt reports she has had a headache and chest tightness since Friday.  Checked BP and 'it was really high'.  Went to UC who sent her to ER for evaluation.  Cardiac eval was WNL w/ exception of elevated BP.  Today is 172/112 and she continues to have a headache.  HR 108.  Has appt upcoming w/ Cardiology on 3/31.    Anxiety/depression- Mom died unexpectedly on 05/19/2022 after having a heart attack on Thursday while riding her bike.  She has anxiety and depression at baseline and feels that this is now 'out of control'  Obesity- wants to work on weight loss given mom's heart attack.  Would like to do supervised medical weight loss but heard that [facility] has a very long wait list.   Review of Systems For ROS see HPI    This visit occurred during the SARS-CoV-2 public health emergency.  Safety protocols were in place, including screening questions prior to the visit, additional usage of staff PPE, and extensive cleaning of exam room while observing appropriate contact time as indicated for disinfecting solutions.      Objective:   Physical Exam Vitals reviewed.  Constitutional:      General: She is in acute distress (tearful).     Appearance: Normal appearance. She is obese. She is not ill-appearing.  HENT:     Head: Normocephalic and atraumatic.  Eyes:     Extraocular Movements: Extraocular movements intact.     Conjunctiva/sclera: Conjunctivae normal.     Pupils: Pupils are equal, round, and reactive to light.  Cardiovascular:     Rate and Rhythm: Regular rhythm. Tachycardia present.     Pulses: Normal pulses.     Heart sounds: Normal heart sounds.  Pulmonary:     Effort: Pulmonary effort is normal. No respiratory distress.     Breath sounds: Normal breath sounds. No wheezing.  Skin:    General: Skin is warm and dry.  Neurological:     General: No focal deficit present.      Mental Status: She is alert and oriented to person, place, and time.  Psychiatric:        Behavior: Behavior normal.     Comments: Tearful, understandably anxious/depressed          Assessment & Plan:

## 2021-04-11 NOTE — Patient Instructions (Addendum)
Follow up in 3 weeks to recheck BP and mood START the Metoprolol once daily USE the Alprazolam as needed for panicked moments We'll call you with your weight loss referral We'll call you with your counseling referral Make sure you allow yourself time to grieve and time to feel Call with any questions or concerns Hang in there!!!

## 2021-04-14 ENCOUNTER — Ambulatory Visit: Payer: BC Managed Care – PPO | Admitting: Family Medicine

## 2021-05-01 ENCOUNTER — Other Ambulatory Visit: Payer: Self-pay

## 2021-05-01 DIAGNOSIS — L709 Acne, unspecified: Secondary | ICD-10-CM | POA: Insufficient documentation

## 2021-05-04 ENCOUNTER — Encounter: Payer: Self-pay | Admitting: Family Medicine

## 2021-05-04 ENCOUNTER — Other Ambulatory Visit: Payer: Self-pay

## 2021-05-04 ENCOUNTER — Ambulatory Visit: Payer: BC Managed Care – PPO | Admitting: Family Medicine

## 2021-05-04 VITALS — BP 144/100 | HR 79 | Temp 99.0°F | Resp 16 | Wt 247.8 lb

## 2021-05-04 DIAGNOSIS — I10 Essential (primary) hypertension: Secondary | ICD-10-CM | POA: Diagnosis not present

## 2021-05-04 DIAGNOSIS — F419 Anxiety disorder, unspecified: Secondary | ICD-10-CM

## 2021-05-04 DIAGNOSIS — F32A Depression, unspecified: Secondary | ICD-10-CM

## 2021-05-04 MED ORDER — AMLODIPINE BESYLATE 5 MG PO TABS: 5.0000 mg | ORAL_TABLET | Freq: Every day | ORAL | 3 refills | Status: DC

## 2021-05-04 NOTE — Assessment & Plan Note (Signed)
New at last visit.  BP has improved w/ addition of Metoprolol but is still not at goal.  Will add Amlodipine as this doesn't require lab monitoring.  Pt expressed understanding and is in agreement w/ plan.  ?

## 2021-05-04 NOTE — Progress Notes (Signed)
? ?  Subjective:  ? ? Patient ID: Stacy Ayala, female    DOB: 1975/03/10, 46 y.o.   MRN: [id number] ? ?HPI ?Anxiety/depression- at last visit we add Alprazolam as needed to her daily Wellbutrin.  We also add Metoprolol.  Alprazolam has been very helpful during the grieving period.  Not using as much at this point.  Never heard from psychology referral. ? ?HTN- dx'd at last visit.  Started on Metoprolol.  BP remains high but improved since last visit (172/112 --> 144/100)  Pt reports home BPs having been running around 140s/80-90s.  + family hx of HTN.  No recent CP, SOB, HAs, visual changes, edema. ? ? ?Review of Systems ?For ROS see HPI  ? ?This visit occurred during the SARS-CoV-2 public health emergency.  Safety protocols were in place, including screening questions prior to the visit, additional usage of staff PPE, and extensive cleaning of exam room while observing appropriate contact time as indicated for disinfecting solutions.   ?   ?Objective:  ? Physical Exam ?Vitals reviewed.  ?Constitutional:   ?   General: She is not in acute distress. ?   Appearance: Normal appearance. She is well-developed. She is obese. She is not ill-appearing.  ?HENT:  ?   Head: Normocephalic and atraumatic.  ?Eyes:  ?   Conjunctiva/sclera: Conjunctivae normal.  ?   Pupils: Pupils are equal, round, and reactive to light.  ?Neck:  ?   Thyroid: No thyromegaly.  ?Cardiovascular:  ?   Rate and Rhythm: Normal rate and regular rhythm.  ?   Pulses: Normal pulses.  ?   Heart sounds: Normal heart sounds. No murmur heard. ?Pulmonary:  ?   Effort: Pulmonary effort is normal. No respiratory distress.  ?   Breath sounds: Normal breath sounds.  ?Abdominal:  ?   General: There is no distension.  ?   Palpations: Abdomen is soft.  ?   Tenderness: There is no abdominal tenderness.  ?Musculoskeletal:  ?   Cervical back: Normal range of motion and neck supple.  ?   Right lower leg: No edema.  ?   Left lower leg: No edema.  ?Lymphadenopathy:  ?   Cervical:  No cervical adenopathy.  ?Skin: ?   General: Skin is warm and dry.  ?Neurological:  ?   General: No focal deficit present.  ?   Mental Status: She is alert and oriented to person, place, and time.  ?Psychiatric:     ?   Mood and Affect: Mood normal.     ?   Behavior: Behavior normal.  ? ? ? ? ? ?   ?Assessment & Plan:  ? ? ?

## 2021-05-04 NOTE — Patient Instructions (Signed)
Follow up in 4-6 weeks to recheck BP ?Add the Amlodipine once daily ?Continue the Metoprolol daily ?Call with any questions or concerns ?Happy Belated Birthday!!! ?

## 2021-05-04 NOTE — Assessment & Plan Note (Signed)
Improved.  Pt has been able to process her grief and is overall feeling much better.  She felt the Alprazolam was helpful to get through the services and is now having to use it much less frequently.  No med changes at this time. ?

## 2021-05-11 ENCOUNTER — Encounter: Payer: Self-pay | Admitting: Cardiology

## 2021-05-11 ENCOUNTER — Ambulatory Visit: Payer: BC Managed Care – PPO | Admitting: Cardiology

## 2021-05-11 VITALS — BP 162/96 | HR 113 | Ht 65.0 in | Wt 249.0 lb

## 2021-05-11 DIAGNOSIS — I10 Essential (primary) hypertension: Secondary | ICD-10-CM

## 2021-05-11 DIAGNOSIS — R079 Chest pain, unspecified: Secondary | ICD-10-CM | POA: Insufficient documentation

## 2021-05-11 HISTORY — DX: Chest pain, unspecified: R07.9

## 2021-05-11 NOTE — Patient Instructions (Signed)
Medication Instructions:  ?Your physician recommends that you continue on your current medications as directed. Please refer to the Current Medication list given to you today.  ?*If you need a refill on your cardiac medications before your next appointment, please call your pharmacy* ? ? ?Lab Work: ?None ordered ?If you have labs (blood work) drawn today and your tests are completely normal, you will receive your results only by: ?MyChart Message (if you have MyChart) OR ?A paper copy in the mail ?If you have any lab test that is abnormal or we need to change your treatment, we will call you to review the results. ? ? ?Testing/Procedures: ? ?    Stress Echocardiogram Information Sheet ? ?                                                    Instructions: ?   ?1. You may take your morning medications the morning of the test ? ?2. Light breakfast. ? ?3. Dress prepared to exercise. ? ?4. DO NOT use ANY caffeine or tobacco products 3 hours before appointment. ? ?5. Please bring all current prescription medications. ? ? ? ?Follow-Up: ?At [facility], you and your health needs are our priority.  As part of our continuing mission to provide you with exceptional heart care, we have created designated Provider Care Teams.  These Care Teams include your primary Cardiologist (physician) and Advanced Practice Providers (APPs -  Physician Assistants and Nurse Practitioners) who all work together to provide you with the care you need, when you need it. ? ?We recommend signing up for the patient portal called "MyChart".  Sign up information is provided on this After Visit Summary.  MyChart is used to connect with patients for Virtual Visits (Telemedicine).  Patients are able to view lab/test results, encounter notes, upcoming appointments, etc.  Non-urgent messages can be sent to your provider as well.   ?To learn more about what you can do with MyChart, go to [email or web address].   ? ?Your next appointment:   ?6  month(s) ? ?The format for your next appointment:   ?In Person ? ?Provider:   ?Jyl Heinz, MD ? ? ?Other Instructions ?Exercise Stress Echocardiogram ?An exercise stress echocardiogram is a test to check how well your heart is working. This test uses sound waves and a computer to make pictures of your heart. These pictures will be taken before and after you exercise. ?For this test, you will walk on a treadmill or ride a bicycle to make your heart beat faster. While you exercise, your heart will be checked with an electrocardiogram (ECG). Your blood pressure will also be checked. ?You may have this test if: ?You have chest pain or a heart problem. ?You had a heart attack or heart surgery not long ago. ?You have heart valve problems. ?You have a condition that causes narrowing of the blood vessels that supply your heart. ?You have a high risk of heart disease and: ?You are starting a new exercise program. ?You need to have a big surgery. ?Tell a doctor about: ?Any allergies you have. ?All medicines you are taking. This includes vitamins, herbs, eye drops, creams, and over-the-counter medicines. ?Any problems you or family members have had with medicines that make you fall asleep (anesthetic medicines). ?Any surgeries you have had. ?Any blood disorders you have. ?Any medical  conditions you have. Whether you are pregnant or may be pregnant. What are the risks? Generally, this is a safe test. However, problems may occur, including: Chest pain. Feeling dizzy or light-headed. Shortness of breath. Increased or irregular heartbeat. Feeling like you may vomit (nausea) or vomiting. Heart attack. This is very rare. What happens before the test? Medicines Ask your doctor about changing or stopping your normal medicines. This is important if you take diabetes medicines or blood thinners. If you use an inhaler, bring it to the test. General instructions Wear comfortable clothes and walking shoes. Follow  instructions from your doctor about what you cannot eat or drink before the test. Do not drink or eat anything that has caffeine in it. Stop having caffeine 24 hours before the test. Do not smoke or use products that contain nicotine or tobacco for 4 hours before the test. If you need help quitting, ask your doctor. What happens during the test?  You will take off your clothes from the waist up and put on a hospital gown. Electrodes or patches will be put on your chest. A blood pressure cuff will be put on your arm. Before you exercise, a computer will make a picture of your heart. To do this: You will lie down and a gel will be put on your chest. A wand will be moved over the gel. Sound waves from the wand will go to the computer to make the picture. Then, you will start to exercise. You may walk on a treadmill or pedal a bicycle. Your blood pressure and heart rhythm will be checked while you exercise. The exercise will get harder or faster. You will exercise until: Your heart reaches a certain level. You are too tired to go on. You cannot go on because of chest pain, weakness, or dizziness. You will lie down right away so another picture of your heart can be taken. The procedure may vary among doctors and hospitals. What can I expect after the test? After your test, it is common to have: Mild soreness. Mild tiredness. Your heart rate and blood pressure will be checked until they return to your normal levels. You should not have any new symptoms after this test. Follow these instructions at home: If your doctor says that you can, you may: Eat what you normally eat. Do your normal activities. Take over-the-counter and prescription medicines only as told by your doctor. Keep all follow-up visits. It is up to you to get the results of your test. Ask how to get your results when they are ready. Contact a doctor if: You feel dizzy or light-headed. You have a fast or irregular  heartbeat. You feel like you may vomit or you vomit. You have a headache. You feel short of breath. Get help right away if: You develop pain or pressure: In your chest. In your jaw or neck. Between your shoulders. That goes down your left arm. You faint. You have trouble breathing. These symptoms may be an emergency. Get medical help right away. Call your local emergency services (911 in the [place] Do not wait to see if the symptoms will go away. Do not drive yourself to the hospital. Summary This is a test that checks how well your heart is working. Follow instructions about what you cannot eat or drink before the test. Ask your doctor if you should take your normal medicines before the test. Stop having caffeine 24 hours before the test. Do not smoke or use products with nicotine   or tobacco in them for 4 hours before the test. ?During the test, your blood pressure and heart rhythm will be checked while you exercise. ?This information is not intended to replace advice given to you by your health care provider. Make sure you discuss any questions you have with your health care provider. ? ?We will order CT coronary calcium score. ?It will cost $99.00 and is not covered by insurance.  ?Please call to schedule.   ? ?[facility]  ?[address]  ?[place], [place] [address] ?[phone] ?           Or ?[facility] ?[address] ?[place], [place] [address] ?[phone]  ?Document Revised: 10/12/2020 Document Reviewed: 09/22/2019 ?Elsevier Patient Education ? [place]. ?  ?

## 2021-05-11 NOTE — Progress Notes (Signed)
?Cardiology Office Note:   ? ?Date:  05/11/2021  ? ?ID:  Stacy Ayala, DOB 1975/10/23, MRN [id number] ? ?PCP:  Midge Minium, MD  ?Cardiologist:  Jenean Lindau, MD  ? ?Referring MD: Tacy Learn, PA-C  ? ? ?ASSESSMENT:   ? ?1. Primary hypertension   ?2. Chest pain of uncertain etiology   ?3. Obesity, Class III, BMI 40-49.9 (morbid obesity) ([place])   ? ?PLAN:   ? ?In order of problems listed above: ? ?Primary prevention stressed with the patient.  Importance of compliance with diet and medication stressed and she vocalized understanding. ?Essential hypertension: Blood pressure stable and diet was emphasized.  Salt intake issues were discussed.  Lifestyle modification was discussed and she promises to do better. ?Chest pain: Atypical in nature but we will do an exercise stress echo in view of the fact that her mother had cardiac issues and patient is concerned about it.  Patient is agreeable. ?Morbid obesity: Weight reduction stressed and she promises to do better.  Diet was emphasized.  Risks of obesity revisited. ?Patient will be seen in follow-up appointment in 6 months or earlier if the patient has any concerns.  I reviewed her lipids from [facility] notes and they are fine. ? ? ?Medication Adjustments/Labs and Tests Ordered: ?Current medicines are reviewed at length with the patient today.  Concerns regarding medicines are outlined above.  ?No orders of the defined types were placed in this encounter. ? ?No orders of the defined types were placed in this encounter. ? ? ? ?History of Present Illness:   ? ?Stacy Ayala is a 46 y.o. female who is being seen today for the evaluation of chest pain and elevated blood pressure at the request of Tacy Learn, PA-C.  Patient is a pleasant 46 year old female.  She has past medical history that is not much significant from a cardiovascular standpoint.  She recently lost her mother.  She was vacationing in [place] when this happened and the patient went to take care of her  final rites.  At that time she had some chest tightness.  Subsequently she is fine.  She has noticed high blood pressure for which she went to the emergency room.  She was treated for this and feels better.  At the time of my evaluation, the patient is alert awake oriented and in no distress. ? ?Past Medical History:  ?Diagnosis Date  ? Acne   ? Anxiety and depression 08/17/2010  ? Bacterial vaginosis 09/07/2020  ? Breast mass 11/23/2010  ? Hypothyroidism 10/23/2013  ? Obesity, Class III, BMI 40-49.9 (morbid obesity) ([place]) 01/07/2017  ? Physical exam 11/23/2010  ? Primary hypertension 04/11/2021  ? Screening for malignant neoplasm of cervix 11/23/2010  ? Soft tissue mass 04/21/2014  ? Vitamin D deficiency 01/07/2017  ? ? ?Past Surgical History:  ?Procedure Laterality Date  ? CESAREAN SECTION    ? ? ?Current Medications: ?Current Meds  ?Medication Sig  ? ALPRAZolam (XANAX) 0.5 MG tablet Take 1 tablet (0.5 mg total) by mouth 3 (three) times daily as needed for anxiety.  ? amLODipine (NORVASC) 5 MG tablet Take 1 tablet (5 mg total) by mouth daily.  ? Ascorbic Acid (VITAMIN C PO) Take 1 tablet by mouth daily.  ? buPROPion (WELLBUTRIN XL) 300 MG 24 hr tablet Take 1 tablet (300 mg total) by mouth daily.  ? Multiple Vitamin (MULTIVITAMIN) tablet Take 1 tablet by mouth daily.    ? Vitamin D, Cholecalciferol, 50 MCG (2000 UT) CAPS Take  2,000 Units by mouth daily.  ?  ? ?Allergies:   Bactrim [sulfamethoxazole-trimethoprim]  ? ?Social History  ? ?Socioeconomic History  ? Marital status: Married  ?  Spouse name: Not on file  ? Number of children: Not on file  ? Years of education: Not on file  ? Highest education level: Not on file  ?Occupational History  ? Not on file  ?Tobacco Use  ? Smoking status: Never  ? Smokeless tobacco: Never  ?Vaping Use  ? Vaping Use: Never used  ?Substance and Sexual Activity  ? Alcohol use: Yes  ?  Comment: occ.  ? Drug use: No  ? Sexual activity: Not on file  ?Other Topics Concern  ? Not on file  ?Social  History Narrative  ? Teacher at [employer], originally from [place], [place].  ? Husband went to [employer].  ?   ?   ? ?Social Determinants of Health  ? ?Financial Resource Strain: Not on file  ?Food Insecurity: Not on file  ?Transportation Needs: Not on file  ?Physical Activity: Not on file  ?Stress: Not on file  ?Social Connections: Not on file  ?  ? ?Family History: ?The patient's family history includes Breast cancer in an other family member; Colon cancer in an other family member; Heart attack in her mother; Hypertension in her mother. There is no history of Colon polyps, Esophageal cancer, Rectal cancer, or Stomach cancer. ? ?ROS:   ?Please see the history of present illness.    ?All other systems reviewed and are negative. ? ?EKGs/Labs/Other Studies Reviewed:   ? ?The following studies were reviewed today: ?EKG reveals sinus rhythm and nonspecific ST-T changes ? ? ?Recent Labs: ?12/09/2020: ALT 14; TSH 2.87 ?04/10/2021: BUN 10; Creatinine, Ser 0.78; Hemoglobin 13.3; Platelets 286; Potassium 4.0; Sodium 136  ?Recent Lipid Panel ?   ?Component Value Date/Time  ? CHOL 157 12/09/2020 0821  ? TRIG 89.0 12/09/2020 0821  ? HDL 40.40 12/09/2020 0821  ? CHOLHDL 4 12/09/2020 0821  ? VLDL 17.8 12/09/2020 0821  ? [place] 99 12/09/2020 0821  ? [place] 103 (H) 01/07/2017 1631  ? ? ?Physical Exam:   ? ?VS:  BP (!) 162/96   Pulse (!) 113   Ht '5\' 5"'$  (1.651 m)   Wt 249 lb (112.9 kg)   SpO2 98%   BMI 41.44 kg/m?    ? ?Wt Readings from Last 3 Encounters:  ?05/11/21 249 lb (112.9 kg)  ?05/04/21 247 lb 12.8 oz (112.4 kg)  ?04/11/21 244 lb 12.8 oz (111 kg)  ?  ? ?GEN: Patient is in no acute distress ?HEENT: Normal ?NECK: No JVD; No carotid bruits ?LYMPHATICS: No lymphadenopathy ?CARDIAC: S1 S2 regular, 2/6 systolic murmur at the apex. ?RESPIRATORY:  Clear to auscultation without rales, wheezing or rhonchi  ?ABDOMEN: Soft, non-tender, non-distended ?MUSCULOSKELETAL:  No edema; No deformity  ?SKIN: Warm and dry ?NEUROLOGIC:  Alert and  oriented x 3 ?PSYCHIATRIC:  Normal affect  ? ? ?Signed, ?Jenean Lindau, MD  ?05/11/2021 4:20 PM    ?[place]   ?

## 2021-05-12 NOTE — Addendum Note (Signed)
Addended by: Jyl Heinz R on: 05/12/2021 10:01 AM ? ? Modules accepted: Orders ? ?

## 2021-05-23 ENCOUNTER — Ambulatory Visit (HOSPITAL_COMMUNITY)
Admission: RE | Admit: 2021-05-23 | Discharge: 2021-05-23 | Disposition: A | Discharge status: Home / Self Care | Payer: Self-pay | Source: Ambulatory Visit | Attending: Cardiology | Admitting: Cardiology

## 2021-05-23 DIAGNOSIS — R079 Chest pain, unspecified: Secondary | ICD-10-CM | POA: Insufficient documentation

## 2021-06-13 ENCOUNTER — Telehealth (HOSPITAL_COMMUNITY): Payer: Self-pay | Admitting: *Deleted

## 2021-06-13 NOTE — Telephone Encounter (Signed)
Left message on voicemail per DPR in reference to upcoming appointment scheduled on 06/19/2021 at 2:00 with detailed instructions given per Stress Test Requisition Sheet for the test. LM to arrive 30 minutes early, and that it is imperative to arrive on time for appointment to keep from having the test rescheduled. If you need to cancel or reschedule your appointment, please call the office within 24 hours of your appointment. Failure to do so may result in a cancellation of your appointment, and a $50 no show fee. Phone number given for call back for any questions. Glade Lloyd S ? ?  ? ?

## 2021-06-14 ENCOUNTER — Encounter: Payer: Self-pay | Admitting: Family Medicine

## 2021-06-14 ENCOUNTER — Ambulatory Visit: Payer: BC Managed Care – PPO | Admitting: Family Medicine

## 2021-06-14 VITALS — BP 140/88 | HR 78 | Temp 98.1°F | Resp 16 | Ht 65.0 in | Wt 250.6 lb

## 2021-06-14 DIAGNOSIS — R911 Solitary pulmonary nodule: Secondary | ICD-10-CM

## 2021-06-14 DIAGNOSIS — I10 Essential (primary) hypertension: Secondary | ICD-10-CM | POA: Diagnosis not present

## 2021-06-14 HISTORY — DX: Solitary pulmonary nodule: R91.1

## 2021-06-14 NOTE — Assessment & Plan Note (Signed)
Ongoing issue.  Pt was confused at last visit and did not continue the Metoprolol when the Amlodipine was added.  BP remains elevated today.  Will have her take both meds and f/u in 6 weeks- when she will be out of work for the summer.  I suspect BP will be much better controlled. ?

## 2021-06-14 NOTE — Assessment & Plan Note (Signed)
Noted on calcium scoring CT.  No follow up was recommended at this time. ?

## 2021-06-14 NOTE — Patient Instructions (Signed)
Follow up in 6 weeks to recheck BP ?ADD the Metoprolol to the Amlodipine once daily ?Continue to work on healthy diet and regular exercise- you're doing great! ?Call with any questions or concerns ?Stay Safe!  Stay Healthy! ?

## 2021-06-14 NOTE — Progress Notes (Signed)
? ?  Subjective:  ? ? Patient ID: Stacy Ayala, female    DOB: 02/02/1976, 46 y.o.   MRN: [id number] ? ?HPI ?HTN- ongoing issue for pt.  BP remains elevated despite addition of Amlodipine '5mg'$  daily.  Pt thought she was to stop the Metoprolol when the Amlodipine was started.  No CP, SOB, HAs, visual changes, edema.  Work has been very stressful ? ?Pulmonary nodule- new.  Seen on Calcium score CT.  67m solid nodule in posterior L lower lobe.  No designated follow up recommended. ? ? ?Review of Systems ?For ROS see HPI  ?   ?Objective:  ? Physical Exam ?Vitals reviewed.  ?Constitutional:   ?   General: She is not in acute distress. ?   Appearance: Normal appearance. She is obese. She is not ill-appearing.  ?HENT:  ?   Head: Normocephalic and atraumatic.  ?Eyes:  ?   Extraocular Movements: Extraocular movements intact.  ?   Conjunctiva/sclera: Conjunctivae normal.  ?   Pupils: Pupils are equal, round, and reactive to light.  ?Cardiovascular:  ?   Rate and Rhythm: Normal rate and regular rhythm.  ?   Pulses: Normal pulses.  ?Pulmonary:  ?   Effort: Pulmonary effort is normal. No respiratory distress.  ?   Breath sounds: Normal breath sounds. No wheezing.  ?Musculoskeletal:  ?   Right lower leg: No edema.  ?   Left lower leg: No edema.  ?Skin: ?   General: Skin is warm and dry.  ?Neurological:  ?   General: No focal deficit present.  ?   Mental Status: She is alert and oriented to person, place, and time.  ?Psychiatric:     ?   Mood and Affect: Mood normal.     ?   Behavior: Behavior normal.     ?   Thought Content: Thought content normal.  ? ? ? ? ? ?   ?Assessment & Plan:  ? ? ?

## 2021-06-15 ENCOUNTER — Encounter: Payer: Self-pay | Admitting: Family Medicine

## 2021-06-15 MED ORDER — METOPROLOL SUCCINATE ER 25 MG PO TB24: 25.0000 mg | ORAL_TABLET | Freq: Every day | ORAL | 3 refills | Status: DC

## 2021-06-16 ENCOUNTER — Encounter: Payer: Self-pay | Admitting: Family Medicine

## 2021-06-19 ENCOUNTER — Ambulatory Visit (HOSPITAL_COMMUNITY): Payer: BC Managed Care – PPO | Attending: Internal Medicine

## 2021-06-19 ENCOUNTER — Ambulatory Visit (HOSPITAL_BASED_OUTPATIENT_CLINIC_OR_DEPARTMENT_OTHER): Payer: BC Managed Care – PPO

## 2021-06-19 DIAGNOSIS — R079 Chest pain, unspecified: Secondary | ICD-10-CM | POA: Diagnosis present

## 2021-06-19 LAB — ECHOCARDIOGRAM STRESS TEST
Area-P 1/2: 7.29 cm2
S' Lateral: 3.2 cm

## 2021-06-19 MED ORDER — PERFLUTREN LIPID MICROSPHERE
1.0000 mL | INTRAVENOUS | Status: AC | PRN
  Administered 2021-06-19: 3 mL via INTRAVENOUS

## 2021-07-27 ENCOUNTER — Encounter: Payer: Self-pay | Admitting: Family Medicine

## 2021-07-27 ENCOUNTER — Ambulatory Visit: Payer: BC Managed Care – PPO | Admitting: Family Medicine

## 2021-07-27 VITALS — BP 134/82 | HR 80 | Temp 98.0°F | Resp 16 | Ht 65.0 in | Wt 257.5 lb

## 2021-07-27 DIAGNOSIS — F419 Anxiety disorder, unspecified: Secondary | ICD-10-CM | POA: Diagnosis not present

## 2021-07-27 DIAGNOSIS — F32A Depression, unspecified: Secondary | ICD-10-CM | POA: Diagnosis not present

## 2021-07-27 DIAGNOSIS — I10 Essential (primary) hypertension: Secondary | ICD-10-CM

## 2021-07-27 NOTE — Progress Notes (Signed)
   Subjective:    Patient ID: Stacy Ayala, female    DOB: [birth year], 46 y.o.   MRN: [id number]  HPI HTN- relatively new problem for pt.  Currently on Amlodipine '5mg'$  daily and Metoprolol '25mg'$  daily w/ good control.  Denies CP, SOB, HAs, visual changes, edema.  Obesity- pt has gained 8 lbs since early May.  Pt is a stress eater and has had a very stressful year.  Has started exercising w/ a partner- weights and walking.  Their goal is to meet 4x/week to work out.  Pt eats fruits and veggies.  Eats chicken, steak.  Eats a lot of [place] food.    Work stress- pt reports work is incredibly stressful and that it 'blows up my life' for 10 months out of the year.  Is aware that she's not managing the stress very well and feels like she could do better.  Interested in counseling.   Review of Systems For ROS see HPI     Objective:   Physical Exam Vitals reviewed.  Constitutional:      General: She is not in acute distress.    Appearance: Normal appearance. She is well-developed. She is obese. She is not ill-appearing.  HENT:     Head: Normocephalic and atraumatic.  Eyes:     Conjunctiva/sclera: Conjunctivae normal.     Pupils: Pupils are equal, round, and reactive to light.  Neck:     Thyroid: No thyromegaly.  Cardiovascular:     Rate and Rhythm: Normal rate and regular rhythm.     Heart sounds: Normal heart sounds. No murmur heard. Pulmonary:     Effort: Pulmonary effort is normal. No respiratory distress.     Breath sounds: Normal breath sounds.  Abdominal:     General: There is no distension.     Palpations: Abdomen is soft.     Tenderness: There is no abdominal tenderness.  Musculoskeletal:     Cervical back: Normal range of motion and neck supple.     Right lower leg: No edema.     Left lower leg: No edema.  Lymphadenopathy:     Cervical: No cervical adenopathy.  Skin:    General: Skin is warm and dry.  Neurological:     Mental Status: She is alert and oriented to person,  place, and time.  Psychiatric:        Behavior: Behavior normal.           Assessment & Plan:

## 2021-07-27 NOTE — Assessment & Plan Note (Signed)
Relatively new problem.  Much better control today on the Amlodipine '5mg'$  daily and Metoprolol '25mg'$  daily.  Currently asymptomatic.  No changes at this time.  Will continue to follow.

## 2021-07-27 NOTE — Assessment & Plan Note (Signed)
Deteriorated.  Pt has gained 7 lbs in 6 weeks.  She is very frustrated by this.  She has gained considerable weight since losing her mother earlier this year.  Knows that she is a stress eater.  Is now exercising regularly and plans to be much more intentional about her eating this summer now that she has time to plan and prepare.  She would be an excellent candidate for [facility] but it is not available at this time.  Will try and initiate once available

## 2021-07-27 NOTE — Patient Instructions (Addendum)
Follow up in 3 months to recheck weight Continue to work on healthy, low carb diet and regular exercise- you can do it! Try and plan ahead for meals and snacks Ask yourself if you're truly hungry Drink LOTS of water We'll call you to schedule counseling Call with any questions or concerns Hang in there!  You can do this!!!

## 2021-07-27 NOTE — Assessment & Plan Note (Signed)
Ongoing issue for pt.  She is aware that she is not managing as well as she could be.  Interested in counseling.  Referral placed.

## 2021-08-03 ENCOUNTER — Other Ambulatory Visit: Payer: Self-pay | Admitting: Family Medicine

## 2021-08-16 ENCOUNTER — Ambulatory Visit (INDEPENDENT_AMBULATORY_CARE_PROVIDER_SITE_OTHER): Payer: BC Managed Care – PPO | Admitting: Psychology

## 2021-08-16 DIAGNOSIS — F4323 Adjustment disorder with mixed anxiety and depressed mood: Secondary | ICD-10-CM

## 2021-08-16 NOTE — Progress Notes (Signed)
[facility] Counselor Initial Adult Exam  Name: Shariyah Eland Date: 08/16/2021 MRN: [id number] DOB: [birth year] PCP: Midge Minium, MD  Time spent: 45 mins  Guardian/Payee:  Pt   Paperwork requested: No   Reason for Visit /Presenting Problem: Pt presents for initial session via webex video.  Pt grants consent for the session, stating she is in her home with no one else present.  I shared with pt that I am in my office with no one else present here.   Mental Status Exam: Appearance:   Casual     Behavior:  Appropriate  Motor:  Normal  Speech/Language:   Clear and Coherent  Affect:  Appropriate  Mood:  normal  Thought process:  normal  Thought content:    WNL  Sensory/Perceptual disturbances:    WNL  Orientation:  oriented to person, place, and time/date  Attention:  Good  Concentration:  Good  Memory:  WNL  Fund of knowledge:   Good  Insight:    Good  Judgment:   Good  Impulse Control:  Good   Reported Symptoms:  Pt shares that Dr. Birdie Riddle suggested she talk with a therapist; prescribed Xanax (prn) and Wellbutrin.  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  Risk Assessment: Danger to Self:  No Self-injurious Behavior: No Danger to Others: No Duty to Warn:no Physical Aggression / Violence:No  Access to Firearms a concern: No  Gang Involvement:No  Patient / guardian was educated about steps to take if suicide or homicide risk level increases between visits: n/a While future psychiatric events cannot be accurately predicted, the patient does not currently require acute inpatient psychiatric care and does not currently meet [facility] involuntary commitment criteria.  Substance Abuse History: Current substance abuse: No ; occasional alcohol   Past Psychiatric  History:   Previous psychological history is significant for depression Outpatient Providers:One outpt therapist about 10 yrs ago; 3 sessions History of Psych Hospitalization: No  Psychological Testing:  none    Abuse History:  Victim of: No.,  none    Report needed: No. Victim of Neglect:No. Perpetrator of  none   Witness / Exposure to Domestic Violence: No   Protective Services Involvement: No  Witness to [employer] Violence:  No   Family History:  Family History  Problem Relation Age of Onset   Hypertension Mother    Heart attack Mother    Colon cancer Other    Breast cancer Other        maternal great aunts   Colon polyps Neg Hx    Esophageal cancer Neg Hx    Rectal cancer Neg Hx    Stomach cancer Neg Hx     Living situation: the patient lives with their family; grew up in [place]; father left family when pt was 46 yo; three sisters (two older and one younger; oldest sister passed away as an adult); mom had to work long and hard to support the girls; Pt shares she calls her dad on Father's day and Christmas; he has been reaching out to pt and her youngest sibling.    Sexual Orientation: Straight  Relationship Status: married; 25 yrs married; couple for 30 yrs Name of spouse / other: Lennette Bihari; Financial planner with [employer] If a parent, number of children / ages: Cian (Keen)--46  yo; stayed in [place] to attend school this summer; he struggled his first semester and improved for the second semester  Support Systems: spouse friends  Museum/gallery curator Stress:  No ; currently paying for college  Income/Employment/Disability: Employment  Armed forces logistics/support/administrative officer: No   Educational History: Education: Forensic psychologist; attended college in [place]  Religion/Sprituality/World View: Agnostic  Any cultural differences that may affect / interfere with treatment:  na  Recreation/Hobbies: crafting; sewing, reading, spending time with husband and family  Stressors: Occupational concerns   ; school can be stressful for pt; COVID made school hard  Strengths: Supportive Relationships and Family; mom was very supportive of pt prior to her death; some friends but not close  Barriers:  none noted   Legal History: Pending legal issue / charges: The patient has no significant history of legal issues. History of legal issue / charges:  none  Medical History/Surgical History: reviewed Past Medical History:  Diagnosis Date   Acne    Anxiety and depression 08/17/2010   Bacterial vaginosis 09/07/2020   Breast mass 11/23/2010   Hypothyroidism 10/23/2013   Obesity, Class III, BMI 40-49.9 (morbid obesity) ([place]) 01/07/2017   Physical exam 11/23/2010   Primary hypertension 04/11/2021   Screening for malignant neoplasm of cervix 11/23/2010   Soft tissue mass 04/21/2014   Vitamin D deficiency 01/07/2017    Past Surgical History:  Procedure Laterality Date   CESAREAN SECTION      Medications: Current Outpatient Medications  Medication Sig Dispense Refill   ALPRAZolam (XANAX) 0.5 MG tablet TAKE 1 TABLET BY MOUTH THREE TIMES DAILY AS NEEDED FOR ANXIETY 45 tablet 0   amLODipine (NORVASC) 5 MG tablet Take 1 tablet (5 mg total) by mouth daily. 30 tablet 3   Ascorbic Acid (VITAMIN C PO) Take 1 tablet by mouth daily.     buPROPion (WELLBUTRIN XL) 300 MG 24 hr tablet Take 1 tablet (300 mg total) by mouth daily. 90 tablet 3   metoprolol succinate (TOPROL-XL) 25 MG 24 hr tablet Take 1 tablet (25 mg total) by mouth daily. 30 tablet 3   Multiple Vitamin (MULTIVITAMIN) tablet Take 1 tablet by mouth daily.       Vitamin D, Cholecalciferol, 50 MCG (2000 UT) CAPS Take 2,000 Units by mouth daily.     No current facility-administered medications for this visit.    Allergies  Allergen Reactions   Bactrim [Sulfamethoxazole-Trimethoprim] Rash    Diagnoses:  Adjustment disorder with mixed anxiety and depressed mood  Plan of Care: Pt shares she worries quite frequently; she tries to distract  herself when she is worrying; her mom died in 2022/04/23 from a heart attack in [facility].    Ivan Anchors, [facility]

## 2021-09-01 ENCOUNTER — Ambulatory Visit (INDEPENDENT_AMBULATORY_CARE_PROVIDER_SITE_OTHER): Payer: BC Managed Care – PPO | Admitting: Psychology

## 2021-09-01 DIAGNOSIS — F4323 Adjustment disorder with mixed anxiety and depressed mood: Secondary | ICD-10-CM
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
## 2021-09-06 ENCOUNTER — Other Ambulatory Visit: Payer: Self-pay | Admitting: Family Medicine

## 2021-09-12 DIAGNOSIS — I10 Essential (primary) hypertension: Secondary | ICD-10-CM | POA: Insufficient documentation

## 2021-09-12 HISTORY — DX: Essential (primary) hypertension: I10

## 2021-09-13 LAB — HM PAP SMEAR

## 2021-09-15 ENCOUNTER — Ambulatory Visit (INDEPENDENT_AMBULATORY_CARE_PROVIDER_SITE_OTHER): Payer: BC Managed Care – PPO | Admitting: Psychology

## 2021-09-15 DIAGNOSIS — F4323 Adjustment disorder with mixed anxiety and depressed mood: Secondary | ICD-10-CM | POA: Diagnosis not present

## 2021-09-15 NOTE — Progress Notes (Signed)
[facility] Counselor/Therapist Progress Note  Patient ID: Stacy Ayala, MRN: [id number],    Date: 09/15/2021  Time Spent: 45 mins  Treatment Type: Individual Therapy  Reported Symptoms: Pt presented for session, via webex video.  Pt granted consent for session, stating she is in her home with no one else present.  I shared with pt that I am in my office with no one else here either.  Mental Status Exam: Appearance:  Casual     Behavior: Appropriate  Motor: Normal  Speech/Language:  Clear and Coherent  Affect: Appropriate  Mood: normal  Thought process: normal  Thought content:   WNL  Sensory/Perceptual disturbances:   WNL  Orientation: oriented to person, place, and time/date  Attention: Good  Concentration: Good  Memory: WNL  Fund of knowledge:  Good  Insight:   Good  Judgment:  Good  Impulse Control: Good   Risk Assessment: Danger to Self:  No Self-injurious Behavior: No Danger to Others: No Duty to Warn:no Physical Aggression / Violence:No  Access to Firearms a concern: No  Gang Involvement:No   Subjective: Pt shares, "I been OK since our last session.  Pt continues to work out regularly and is proud that she has kept up with working out.  They went to [facility] to bring [place] home for a couple of weeks and she is glad to have him home.  She is trying to think about how she can keep up her work out routine; she has a friend she is working out with and having that partner helps motivate them.  Her friend is also a Pharmacist, hospital.  She shares that she has often focused much of her out of school life on school/student needs.  Talked about the need to take care of ourselves as a means of recharging our batteries or filling our buckets back up.  This seems to resonate with pt.  Pt continues to take her Welbutrin daily and she only took Xanax once since our last session; it was for her annual PAP smear.  Pt has continued to read regularly and enjoys that.  Pt continues to  grieve her mom's passing.  Her step-dad Eduard Clos) told her this past week that "it has been 5 and a half months and I am ready to move on."  This hurt pt's feelings, but she does not like her step-dad so much anyway.  He told pt that he has "purged all of her stuff and I am ready to move on."  He is planning to have a memorial service in October when her Alroy Bailiff is installed.  Pt is not sure whether or not she wants to attend that event.  It will be in [place] stated and might be hard for her to attend.  Pt returns to school on 8/17; she and her 5 colleagues are going on a retreat next week to plan for the year.  She teaches at [employer].  She has purchased 5 sets of school supplies for students who might need them.  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  Encouraged pt to continue with her self care activities and we will meet in 4 wks for a follow session, due to the beginning of school in 2 wks.     Interventions: Cognitive Behavioral Therapy  Diagnosis:Adjustment disorder with mixed anxiety and depressed mood  Plan: Treatment Plan Strengths/Abilities:  Intelligent, Intuitive, Willing to participate in therapy Treatment Preferences:  Outpatient Individual Therapy  Statement of Needs:  Patient is to use CBT, mindfulness and coping skills to help manage and/or decrease symptoms associated with their diagnosis. Symptoms:  Depressed/Irritable mood, worry, social withdrawal Problems Addressed:  Depressive thoughts, Sadness, Sleep issues, etc. Long Term Goals:  Pt to reduce overall level, frequency, and intensity of the feelings of depression/anxiety as evidenced by decreased irritability, negative self talk, and helpless feelings from 6 to 7 days/week to 0 to 1 days/week, per client report, for at least 3 consecutive months.  Progress: 10% Short Term Goals:  Pt to verbally express understanding of the relationship between feelings of depression/anxiety  and their impact on thinking patterns and behaviors.  Pt to verbalize an understanding of the role that distorted thinking plays in creating fears, excessive worry, and ruminations.  Progress: 10% Target Date:  09/02/2022 Frequency:  Bi-weekly Modality:  Cognitive Behavioral Therapy Interventions by Therapist:  Therapist will use CBT, Mindfulness exercises, Coping skills and Referrals, as needed by client. Client has verbally approved this treatment plan.  Ivan Anchors, [facility]

## 2021-09-18 ENCOUNTER — Other Ambulatory Visit: Payer: Self-pay | Admitting: Obstetrics and Gynecology

## 2021-09-18 DIAGNOSIS — R928 Other abnormal and inconclusive findings on diagnostic imaging of breast: Secondary | ICD-10-CM

## 2021-09-25 ENCOUNTER — Ambulatory Visit: Payer: BC Managed Care – PPO

## 2021-09-25 ENCOUNTER — Ambulatory Visit
Admission: RE | Admit: 2021-09-25 | Discharge: 2021-09-25 | Disposition: A | Discharge status: Home / Self Care | Payer: BC Managed Care – PPO | Source: Ambulatory Visit | Attending: Obstetrics and Gynecology | Admitting: Obstetrics and Gynecology

## 2021-09-25 DIAGNOSIS — R928 Other abnormal and inconclusive findings on diagnostic imaging of breast: Secondary | ICD-10-CM

## 2021-09-25 LAB — HM MAMMOGRAPHY

## 2021-09-30 ENCOUNTER — Other Ambulatory Visit: Payer: Self-pay | Admitting: Family Medicine

## 2021-10-13 ENCOUNTER — Ambulatory Visit: Payer: BC Managed Care – PPO | Admitting: Psychology

## 2021-10-13 DIAGNOSIS — F4323 Adjustment disorder with mixed anxiety and depressed mood: Secondary | ICD-10-CM

## 2021-10-13 NOTE — Progress Notes (Signed)
[facility] Counselor/Therapist Progress Note  Patient ID: Stacy Ayala, MRN: [id number],    Date: 10/13/2021  Time Spent: 45 mins  Treatment Type: Individual Therapy  Reported Symptoms: Pt presented for session, via webex video.  Pt granted consent for session, stating she is in her home with no one else present.  I shared with pt that I am in my office with no one else here either.  Mental Status Exam: Appearance:  Casual     Behavior: Appropriate  Motor: Normal  Speech/Language:  Clear and Coherent  Affect: Appropriate  Mood: normal  Thought process: normal  Thought content:   WNL  Sensory/Perceptual disturbances:   WNL  Orientation: oriented to person, place, and time/date  Attention: Good  Concentration: Good  Memory: WNL  Fund of knowledge:  Good  Insight:   Good  Judgment:  Good  Impulse Control: Good   Risk Assessment: Danger to Self:  No Self-injurious Behavior: No Danger to Others: No Duty to Warn:no Physical Aggression / Violence:No  Access to Firearms a concern: No  Gang Involvement:No   Subjective: Pt shares, "I have been good since our last session.  The beginning of the year has been really good so far."  Pt shares she sometimes thinks about what might happen before they happen.  This is stressful for her, "worrying about what might happen."  We talked about the possibility to change her perspective of focusing on or anticipating problems.  Asked pt to consider if there have been situations in her life that she was unprepared for that did not go well for her.  She will think about this and we will discuss her thoughts next session.  Pt shares that she and her friend are continuing to work out three days this week at school.  She is also drinking more water this year during school and feels good about that.  She is also focusing on how well her class is doing so far and looks forward to working with these kids this year.  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  This was very impactful for pt.  Pt shares that her step-father Eduard Clos) asked pt to be the Supplemental Trustee for her mom's estate and she agreed to it.  Pt continues to take her Welbutrin daily.  Pt has continued to read regularly and enjoys that.  Pt continues to grieve her mom's passing.  Encouraged pt to continue with her self care activities and we will meet in 3 wks for a follow session.     Interventions: Cognitive Behavioral Therapy  Diagnosis:Adjustment disorder with mixed anxiety and depressed mood  Plan: Treatment Plan Strengths/Abilities:  Intelligent, Intuitive, Willing to participate in therapy Treatment Preferences:  Outpatient Individual Therapy Statement of Needs:  Patient is to use CBT, mindfulness and coping skills to help manage and/or decrease symptoms associated with their diagnosis. Symptoms:  Depressed/Irritable mood, worry, social withdrawal Problems Addressed:  Depressive thoughts, Sadness, Sleep issues, etc. Long Term Goals:  Pt to reduce overall level, frequency, and intensity of the feelings of depression/anxiety as evidenced by decreased irritability, negative self talk, and helpless feelings from 6 to 7 days/week to 0 to 1 days/week, per client report, for at least 3 consecutive months.  Progress: 10% Short Term Goals:  Pt to verbally express understanding of the relationship between feelings of depression/anxiety and their  impact on thinking patterns and behaviors.  Pt to verbalize an understanding of the role that distorted thinking plays in creating fears, excessive worry, and ruminations.  Progress: 10% Target Date:  09/02/2022 Frequency:  Bi-weekly Modality:  Cognitive Behavioral Therapy Interventions by Therapist:  Therapist will use CBT, Mindfulness exercises, Coping skills and Referrals, as needed by client. Client has verbally approved  this treatment plan.  Ivan Anchors, [facility]

## 2021-10-26 ENCOUNTER — Other Ambulatory Visit: Payer: Self-pay | Admitting: Family Medicine

## 2021-11-02 ENCOUNTER — Ambulatory Visit: Payer: BC Managed Care – PPO | Admitting: Psychology

## 2021-11-02 DIAGNOSIS — F4323 Adjustment disorder with mixed anxiety and depressed mood: Secondary | ICD-10-CM | POA: Diagnosis not present

## 2021-11-02 NOTE — Progress Notes (Signed)
[place] Counselor/Therapist Progress Note  Patient ID: Stacy Ayala, MRN: [id number],    Date: 11/02/2021  Time Spent: 45 mins  Treatment Type: Individual Therapy  Reported Symptoms: Pt presented for session, via webex video.  Pt granted consent for session, stating she is in her home with no one else present.  I shared with pt that I am in my office with no one else here either.  Mental Status Exam: Appearance:  Casual     Behavior: Appropriate  Motor: Normal  Speech/Language:  Clear and Coherent  Affect: Appropriate  Mood: normal  Thought process: normal  Thought content:   WNL  Sensory/Perceptual disturbances:   WNL  Orientation: oriented to person, place, and time/date  Attention: Good  Concentration: Good  Memory: WNL  Fund of knowledge:  Good  Insight:   Good  Judgment:  Good  Impulse Control: Good   Risk Assessment: Danger to Self:  No Self-injurious Behavior: No Danger to Others: No Duty to Warn:no Physical Aggression / Violence:No  Access to Firearms a concern: No  Gang Involvement:No   Subjective: Pt shares, "I have been good since our last session.  Things seem so much better this year than it was last year.  This is how it should be.  Things at home are going well too.  We went to visit our son last weekend and had a great time."  Pt shares that they talked with him about his struggles last Fall; he got frustrated with them for bringing it up again.  Talked with pt about them having a formal conversation with Dewaine Conger about their concern for him and that they will continue to support him in any way that he needs them to be and to encourage him to let them know if he needs anything from them.  Pt shares that Charlie asked to come to visit them for a weekend (the weekend after her memorial service-10/21-22).  Pt has told Eduard Clos that she is not coming to the [employer].  Pt is not excited about him coming to visit with them.  Pt shares that she has  been thinking more about our conversation last session about believing in herself and paying attention to her previous successes.  She continues to question herself but she shares that this process has helped her focus on that she knows what she knows.  Encouraged pt to continue to give herself credit for the good work that she does.  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  She is walking during her recess period at school.  Encouraged pt to continue with her water drinking and talking with her partner about splitting up and staying accountable with each other.  Pt continues to take her Welbutrin daily.  Pt has continued to read regularly and enjoys that.  Pt continues to grieve her mom's passing.  Encouraged pt to continue with her self care activities and we will meet in 4 wks for a follow session, due to my vacation.     Interventions: Cognitive Behavioral Therapy  Diagnosis:Adjustment disorder with mixed anxiety and depressed mood  Plan: Treatment Plan Strengths/Abilities:  Intelligent, Intuitive, Willing to participate in therapy Treatment Preferences:  Outpatient Individual Therapy Statement of Needs:  Patient is to use CBT, mindfulness and coping skills to help manage and/or decrease symptoms associated with their  diagnosis. Symptoms:  Depressed/Irritable mood, worry, social withdrawal Problems Addressed:  Depressive thoughts, Sadness, Sleep issues, etc. Long Term Goals:  Pt to reduce overall level, frequency, and intensity of the feelings of depression/anxiety as evidenced by decreased irritability, negative self talk, and helpless feelings from 6 to 7 days/week to 0 to 1 days/week, per client report, for at least 3 consecutive months.  Progress: 10% Short Term Goals:  Pt to verbally express understanding of the relationship between feelings of  depression/anxiety and their impact on thinking patterns and behaviors.  Pt to verbalize an understanding of the role that distorted thinking plays in creating fears, excessive worry, and ruminations.  Progress: 10% Target Date:  09/02/2022 Frequency:  Bi-weekly Modality:  Cognitive Behavioral Therapy Interventions by Therapist:  Therapist will use CBT, Mindfulness exercises, Coping skills and Referrals, as needed by client. Client has verbally approved this treatment plan.  Ivan Anchors, [facility]

## 2021-11-28 ENCOUNTER — Other Ambulatory Visit: Payer: Self-pay | Admitting: Family Medicine

## 2021-11-30 ENCOUNTER — Ambulatory Visit: Payer: BC Managed Care – PPO | Admitting: Psychology

## 2021-11-30 DIAGNOSIS — F4323 Adjustment disorder with mixed anxiety and depressed mood: Secondary | ICD-10-CM

## 2021-11-30 NOTE — Progress Notes (Signed)
[place] Counselor/Therapist Progress Note  Patient ID: Stacy Ayala, MRN: [id number],    Date: 11/30/2021  Time Spent: 45 mins  Treatment Type: Individual Therapy  Reported Symptoms: Pt presented for session, via webex video.  Pt granted consent for session, stating she is in her home with no one else present.  I shared with pt that I am in my office with no one else here either.  Mental Status Exam: Appearance:  Casual     Behavior: Appropriate  Motor: Normal  Speech/Language:  Clear and Coherent  Affect: Appropriate  Mood: normal  Thought process: normal  Thought content:   WNL  Sensory/Perceptual disturbances:   WNL  Orientation: oriented to person, place, and time/date  Attention: Good  Concentration: Good  Memory: WNL  Fund of knowledge:  Good  Insight:   Good  Judgment:  Good  Impulse Control: Good   Risk Assessment: Danger to Self:  No Self-injurious Behavior: No Danger to Others: No Duty to Warn:no Physical Aggression / Violence:No  Access to Firearms a concern: No  Gang Involvement:No   Subjective: Pt shares, "I have been good since our last session.  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  Pt shares that was difficult for them as a group at school.  Pt shares that they have had visitors in their home the past two weekends (her brother in law and her dad) and her step dad is coming this weekend and she is a bit tense about this coming visit.  She shares the time with her dad "went better than I expected but was still stressful."  Lennette Bihari is not happy about Eduard Clos coming this weekend.  "It will be interested to see how this weekend goes."  They are planning to go to [facility] to see Dewaine Conger on Saturday and will all go out to lunch.  Pt shares she has not been working out as frequently because of the demands of  school at the end of the 9 wks.  Pt shares that she continues to lose weight and is happy about that.  Encouraged pt to get back into exercising when the 9 wks is over.  Pt also has not been drinking as much water as she was drinking before; encouraged pt to look for opportunities to drink water during her day.  Pt continues to take her Welbutrin daily.  Pt is wondering if she needs to continue to take the medication.  Encouraged pt to continue with it at this point and we will see what the future holds.  Pt shares she has fallen off of her reading for pleasure.  Pt continues to grieve her mom's passing.  Encouraged pt to continue with her self care activities and we will meet in 3 wks for a follow up session.     Interventions: Cognitive Behavioral Therapy  Diagnosis:Adjustment disorder with mixed anxiety and depressed mood  Plan: Treatment Plan Strengths/Abilities:  Intelligent, Intuitive, Willing to participate in therapy Treatment Preferences:  Outpatient Individual Therapy Statement of Needs:  Patient is to use CBT, mindfulness and coping skills to help manage and/or decrease symptoms associated with their diagnosis. Symptoms:  Depressed/Irritable mood, worry, social withdrawal Problems Addressed:  Depressive thoughts, Sadness, Sleep issues, etc. Long Term Goals:  Pt to reduce overall level, frequency, and intensity of the feelings of depression/anxiety as  evidenced by decreased irritability, negative self talk, and helpless feelings from 6 to 7 days/week to 0 to 1 days/week, per client report, for at least 3 consecutive months.  Progress: 10% Short Term Goals:  Pt to verbally express understanding of the relationship between feelings of depression/anxiety and their impact on thinking patterns and behaviors.  Pt to verbalize an understanding of the role that distorted thinking plays in creating fears, excessive worry, and ruminations.  Progress: 10% Target Date:  09/02/2022 Frequency:   Bi-weekly Modality:  Cognitive Behavioral Therapy Interventions by Therapist:  Therapist will use CBT, Mindfulness exercises, Coping skills and Referrals, as needed by client. Client has verbally approved this treatment plan.  Ivan Anchors, [facility]

## 2021-12-04 ENCOUNTER — Other Ambulatory Visit: Payer: Self-pay

## 2021-12-11 ENCOUNTER — Encounter: Payer: BC Managed Care – PPO | Admitting: Family Medicine

## 2021-12-13 ENCOUNTER — Ambulatory Visit: Payer: BC Managed Care – PPO | Attending: Cardiology | Admitting: Cardiology

## 2021-12-13 ENCOUNTER — Encounter: Payer: Self-pay | Admitting: Cardiology

## 2021-12-13 VITALS — BP 140/88 | HR 82 | Ht 65.0 in | Wt 220.1 lb

## 2021-12-13 DIAGNOSIS — E669 Obesity, unspecified: Secondary | ICD-10-CM

## 2021-12-13 DIAGNOSIS — E782 Mixed hyperlipidemia: Secondary | ICD-10-CM

## 2021-12-13 DIAGNOSIS — I10 Essential (primary) hypertension: Secondary | ICD-10-CM

## 2021-12-13 HISTORY — DX: Obesity, unspecified: E66.9

## 2021-12-13 HISTORY — DX: Mixed hyperlipidemia: E78.2

## 2021-12-13 MED ORDER — AMLODIPINE BESYLATE 5 MG PO TABS: 7.5000 mg | ORAL_TABLET | Freq: Every day | ORAL | 0 refills | Status: DC

## 2021-12-13 NOTE — Progress Notes (Signed)
Cardiology Office Note:    Date:  12/13/2021   ID:  Stacy Ayala, DOB 02-13-1976, MRN [id number]  PCP:  Midge Minium, MD  Cardiologist:  Jenean Lindau, MD   Referring MD: Midge Minium, MD    ASSESSMENT:    1. Primary hypertension   2. Obesity (BMI 35.0-39.9 without comorbidity)   3. Mixed dyslipidemia    PLAN:    In order of problems listed above:  Primary prevention stressed to the patient.  Importance of compliance with diet modifications stressed and she vocalized understanding.  She was advised to walk at least half an hour a day 5 days a week and she promises to do so. Essential hypertension: Blood pressure is elevated at home also.  Salt intake issues and diet was emphasized.  Exercise stressed.  I increased amlodipine to 7.5 mg daily.  She will keep a track of her blood pressures and send it to [place] in a week. Mixed dyslipidemia: Diet was emphasized.  Lifestyle modification urged and she promises to do better. Obesity: She is doing well with exercise and diet and also is in touch with her primary care for management of obesity.  I congratulated her about her weight loss. Patient will be seen in follow-up appointment in 6 months or earlier if the patient has any concerns    Medication Adjustments/Labs and Tests Ordered: Current medicines are reviewed at length with the patient today.  Concerns regarding medicines are outlined above.  No orders of the defined types were placed in this encounter.  No orders of the defined types were placed in this encounter.    No chief complaint on file.    History of Present Illness:    Stacy Ayala is a 46 y.o. female.  Patient has past medical history of essential hypertension mixed dyslipidemia and obesity.  She denies any problems at this time and takes care of activities of daily living.  No chest pain orthopnea or PND.  She mentions to me that she checks her blood pressure at home and is elevated.  She is working hard  to lose weight and tells me that she is successful at this.  At the time of my evaluation, the patient is alert awake oriented and in no distress.  Past Medical History:  Diagnosis Date   Acne    Anxiety and depression 08/17/2010   Bacterial vaginosis 09/07/2020   Breast mass 11/23/2010   Chest pain of uncertain etiology 7/84/6962   Hypertensive disorder 09/12/2021   Hypothyroidism 10/23/2013   Obesity, Class III, BMI 40-49.9 (morbid obesity) ([place]) 01/07/2017   Physical exam 11/23/2010   Primary hypertension 04/11/2021   Screening for malignant neoplasm of cervix 11/23/2010   Soft tissue mass 04/21/2014   Solitary pulmonary nodule on lung CT 06/14/2021   Vitamin D deficiency 01/07/2017    Past Surgical History:  Procedure Laterality Date   CESAREAN SECTION      Current Medications: Current Meds  Medication Sig   ALPRAZolam (XANAX) 0.5 MG tablet TAKE 1 TABLET BY MOUTH THREE TIMES DAILY AS NEEDED FOR ANXIETY   amLODipine (NORVASC) 5 MG tablet Take 1 tablet by mouth once daily   Ascorbic Acid (VITAMIN C PO) Take 1 tablet by mouth daily.   buPROPion (WELLBUTRIN XL) 300 MG 24 hr tablet Take 1 tablet (300 mg total) by mouth daily.   metoprolol succinate (TOPROL-XL) 25 MG 24 hr tablet Take 1 tablet (25 mg total) by mouth daily.   Multiple Vitamin (MULTIVITAMIN) tablet  Take 1 tablet by mouth daily.     omeprazole (PRILOSEC) 40 MG capsule Take 40 mg by mouth daily.   Semaglutide-Weight Management (WEGOVY) 1 MG/0.5ML SOAJ Inject 1 mg into the skin once a week.   Vitamin D, Cholecalciferol, 50 MCG (2000 UT) CAPS Take 2,000 Units by mouth daily.     Allergies:   Bactrim [sulfamethoxazole-trimethoprim]   Social History   Socioeconomic History   Marital status: Married    Spouse name: Not on file   Number of children: Not on file   Years of education: Not on file   Highest education level: Not on file  Occupational History   Not on file  Tobacco Use   Smoking status: Never   Smokeless  tobacco: Never  Vaping Use   Vaping Use: Never used  Substance and Sexual Activity   Alcohol use: Yes    Comment: occ.   Drug use: No   Sexual activity: Not on file  Other Topics Concern   Not on file  Social History Narrative   Teacher at [employer], originally from [place], [place].   Husband went to [employer].         Social Determinants of Health   Financial Resource Strain: Not on file  Food Insecurity: Not on file  Transportation Needs: Not on file  Physical Activity: Not on file  Stress: Not on file  Social Connections: Not on file     Family History: The patient's family history includes Breast cancer in an other family member; Colon cancer in an other family member; Heart attack in her mother; Hypertension in her mother. There is no history of Colon polyps, Esophageal cancer, Rectal cancer, or Stomach cancer.  ROS:   Please see the history of present illness.    All other systems reviewed and are negative.  EKGs/Labs/Other Studies Reviewed:    The following studies were reviewed today: I discussed my findings with the patient at length.   Recent Labs: 04/10/2021: BUN 10; Creatinine, Ser 0.78; Hemoglobin 13.3; Platelets 286; Potassium 4.0; Sodium 136  Recent Lipid Panel    Component Value Date/Time   CHOL 157 12/09/2020 0821   TRIG 89.0 12/09/2020 0821   HDL 40.40 12/09/2020 0821   CHOLHDL 4 12/09/2020 0821   VLDL 17.8 12/09/2020 0821   LDLCALC 99 12/09/2020 0821   LDLCALC 103 (H) 01/07/2017 1631    Physical Exam:    VS:  BP (!) 140/88   Pulse 82   Ht '5\' 5"'$  (1.651 m)   Wt 220 lb 1.3 oz (99.8 kg)   SpO2 99%   BMI 36.62 kg/m     Wt Readings from Last 3 Encounters:  12/13/21 220 lb 1.3 oz (99.8 kg)  07/27/21 257 lb 8 oz (116.8 kg)  06/14/21 250 lb 9.6 oz (113.7 kg)     GEN: Patient is in no acute distress HEENT: Normal NECK: No JVD; No carotid bruits LYMPHATICS: No lymphadenopathy CARDIAC: Hear sounds regular, 2/6 systolic murmur at the  apex. RESPIRATORY:  Clear to auscultation without rales, wheezing or rhonchi  ABDOMEN: Soft, non-tender, non-distended MUSCULOSKELETAL:  No edema; No deformity  SKIN: Warm and dry NEUROLOGIC:  Alert and oriented x 3 PSYCHIATRIC:  Normal affect   Signed, Jenean Lindau, MD  12/13/2021 4:31 PM    [facility]

## 2021-12-13 NOTE — Patient Instructions (Signed)
Medication Instructions:  Your physician has recommended you make the following change in your medication:   Increase your Amlodipine to 7.5 mg daily. Keep a log of your BP for 1 week and sent it via your MyChart.   Take your BP readings 1-2 hours after your medication and again in the evening.  *If you need a refill on your cardiac medications before your next appointment, please call your pharmacy*   Lab Work: None ordered If you have labs (blood work) drawn today and your tests are completely normal, you will receive your results only by: [place] (if you have MyChart) OR A paper copy in the mail If you have any lab test that is abnormal or we need to change your treatment, we will call you to review the results.   Testing/Procedures: None ordered   Follow-Up: At [facility], you and your health needs are our priority.  As part of our continuing mission to provide you with exceptional heart care, we have created designated Provider Care Teams.  These Care Teams include your primary Cardiologist (physician) and Advanced Practice Providers (APPs -  Physician Assistants and Nurse Practitioners) who all work together to provide you with the care you need, when you need it.  We recommend signing up for the patient portal called "MyChart".  Sign up information is provided on this After Visit Summary.  MyChart is used to connect with patients for Virtual Visits (Telemedicine).  Patients are able to view lab/test results, encounter notes, upcoming appointments, etc.  Non-urgent messages can be sent to your provider as well.   To learn more about what you can do with MyChart, go to [email or web address].    Your next appointment:   12 month(s)  The format for your next appointment:   In Person  Provider:   Jyl Heinz, MD   Other Instructions NA

## 2021-12-15 ENCOUNTER — Encounter: Payer: Self-pay | Admitting: Family Medicine

## 2021-12-15 ENCOUNTER — Ambulatory Visit (INDEPENDENT_AMBULATORY_CARE_PROVIDER_SITE_OTHER): Payer: BC Managed Care – PPO | Admitting: Family Medicine

## 2021-12-15 VITALS — BP 128/80 | HR 91 | Temp 99.0°F | Resp 19 | Ht 65.0 in | Wt 220.0 lb

## 2021-12-15 DIAGNOSIS — Z0000 Encounter for general adult medical examination without abnormal findings: Secondary | ICD-10-CM | POA: Diagnosis not present

## 2021-12-15 DIAGNOSIS — E559 Vitamin D deficiency, unspecified: Secondary | ICD-10-CM

## 2021-12-15 DIAGNOSIS — E669 Obesity, unspecified: Secondary | ICD-10-CM | POA: Diagnosis not present

## 2021-12-15 LAB — HEPATIC FUNCTION PANEL
ALT: 15 U/L (ref 0–35)
AST: 19 U/L (ref 0–37)
Albumin: 4.3 g/dL (ref 3.5–5.2)
Alkaline Phosphatase: 45 U/L (ref 39–117)
Bilirubin, Direct: 0.1 mg/dL (ref 0.0–0.3)
Total Bilirubin: 0.4 mg/dL (ref 0.2–1.2)
Total Protein: 8.2 g/dL (ref 6.0–8.3)

## 2021-12-15 LAB — CBC WITH DIFFERENTIAL/PLATELET
Basophils Absolute: 0 10*3/uL (ref 0.0–0.1)
Basophils Relative: 0.3 % (ref 0.0–3.0)
Eosinophils Absolute: 0 10*3/uL (ref 0.0–0.7)
Eosinophils Relative: 0.8 % (ref 0.0–5.0)
HCT: 40.8 % (ref 36.0–46.0)
Hemoglobin: 13.6 g/dL (ref 12.0–15.0)
Lymphocytes Relative: 30.4 % (ref 12.0–46.0)
Lymphs Abs: 1.6 10*3/uL (ref 0.7–4.0)
MCHC: 33.3 g/dL (ref 30.0–36.0)
MCV: 89.8 fl (ref 78.0–100.0)
Monocytes Absolute: 0.4 10*3/uL (ref 0.1–1.0)
Monocytes Relative: 8.4 % (ref 3.0–12.0)
Neutro Abs: 3.2 10*3/uL (ref 1.4–7.7)
Neutrophils Relative %: 60.1 % (ref 43.0–77.0)
Platelets: 260 10*3/uL (ref 150.0–400.0)
RBC: 4.54 Mil/uL (ref 3.87–5.11)
RDW: 12.2 % (ref 11.5–15.5)
WBC: 5.4 10*3/uL (ref 4.0–10.5)

## 2021-12-15 LAB — BASIC METABOLIC PANEL
BUN: 9 mg/dL (ref 6–23)
CO2: 28 mEq/L (ref 19–32)
Calcium: 9.6 mg/dL (ref 8.4–10.5)
Chloride: 100 mEq/L (ref 96–112)
Creatinine, Ser: 0.66 mg/dL (ref 0.40–1.20)
GFR: 105.02 mL/min (ref >60.00)
Glucose, Bld: 77 mg/dL (ref 70–99)
Potassium: 4.2 mEq/L (ref 3.5–5.1)
Sodium: 135 mEq/L (ref 135–145)

## 2021-12-15 LAB — LIPID PANEL
Cholesterol: 149 mg/dL (ref 0–200)
HDL: 34.6 mg/dL — ABNORMAL LOW (ref >39.00)
LDL Cholesterol: 94 mg/dL (ref 0–99)
NonHDL: 114.33
Total CHOL/HDL Ratio: 4
Triglycerides: 100 mg/dL (ref 0.0–149.0)
VLDL: 20 mg/dL (ref 0.0–40.0)

## 2021-12-15 LAB — TSH: TSH: 2.79 u[IU]/mL (ref 0.35–5.50)

## 2021-12-15 LAB — VITAMIN D 25 HYDROXY (VIT D DEFICIENCY, FRACTURES): VITD: 67.77 ng/mL (ref 30.00–100.00)

## 2021-12-15 NOTE — Patient Instructions (Signed)
Follow up in 6 months to recheck BP We'll notify you of your lab results and make any changes if needed Keep up the good work on healthy diet and regular exercise- you're doing great!!! Call with any questions or concerns Stay Safe!  Stay Healthy! Happy Holidays!!!

## 2021-12-15 NOTE — Progress Notes (Signed)
   Subjective:    Patient ID: Toniesha Zellner, female    DOB: 1976/01/02, 46 y.o.   MRN: [id number]  HPI CPE- UTD on pap, mammo, Tdap, colonoscopy, flu.  Health Maintenance  Topic Date Due   PAP SMEAR-Modifier  08/28/2022   MAMMOGRAM  09/13/2022   TETANUS/TDAP  10/24/2023   COLONOSCOPY (Pts 45-58yr Insurance coverage will need to be confirmed)  09/06/2025   INFLUENZA VACCINE  Completed   HPV VACCINES  Aged Out   COVID-19 Vaccine  Discontinued   Hepatitis C Screening  Discontinued   HIV Screening  Discontinued      Review of Systems Patient reports no vision/ hearing changes, adenopathy,fever, persistant/recurrent hoarseness , swallowing issues, chest pain, palpitations, edema, persistant/recurrent cough, hemoptysis, dyspnea (rest/exertional/paroxysmal nocturnal), gastrointestinal bleeding (melena, rectal bleeding), abdominal pain, significant heartburn, bowel changes, GU symptoms (dysuria, hematuria, incontinence), Gyn symptoms (abnormal  bleeding, pain),  syncope, focal weakness, memory loss, numbness & tingling, skin/hair/nail changes, abnormal bruising or bleeding, anxiety, or depression.   + 40 lb weight loss    Objective:   Physical Exam General Appearance:    Alert, cooperative, no distress, appears stated age  Head:    Normocephalic, without obvious abnormality, atraumatic  Eyes:    PERRL, conjunctiva/corneas clear, EOM's intact, fundi    benign, both eyes  Ears:    Normal TM's and external ear canals, both ears  Nose:   Nares normal, septum midline, mucosa normal, no drainage    or sinus tenderness  Throat:   Lips, mucosa, and tongue normal; teeth and gums normal  Neck:   Supple, symmetrical, trachea midline, no adenopathy;    Thyroid: no enlargement/tenderness/nodules  Back:     Symmetric, no curvature, ROM normal, no CVA tenderness  Lungs:     Clear to auscultation bilaterally, respirations unlabored  Chest Wall:    No tenderness or deformity   Heart:    Regular rate and  rhythm, S1 and S2 normal, no murmur, rub   or gallop  Breast Exam:    Deferred to mammo  Abdomen:     Soft, non-tender, bowel sounds active all four quadrants,    no masses, no organomegaly  Genitalia:    Deferred  Rectal:    Extremities:   Extremities normal, atraumatic, no cyanosis or edema  Pulses:   2+ and symmetric all extremities  Skin:   Skin color, texture, turgor normal, no rashes or lesions  Lymph nodes:   Cervical, supraclavicular, and axillary nodes normal  Neurologic:   CNII-XII intact, normal strength, sensation and reflexes    throughout          Assessment & Plan:

## 2021-12-15 NOTE — Assessment & Plan Note (Signed)
Check labs and replete prn. 

## 2021-12-15 NOTE — Assessment & Plan Note (Signed)
Pt's PE WNL w/ exception of BMI.  UTD on pap, mammo, colonoscopy, Tdap, flu.  Applauded her weight loss efforts- down 40 lbs.  Check labs.  Anticipatory guidance provided.

## 2021-12-15 NOTE — Assessment & Plan Note (Signed)
Pt is down 40 lbs since last visit!  Doing well on Wegovy and exercising regularly.  Applauded her efforts and encouraged her to continue.  Check labs to risk stratify.  Will follow.

## 2021-12-18 NOTE — Progress Notes (Signed)
Informed pt of lab results  

## 2021-12-21 ENCOUNTER — Encounter: Payer: Self-pay | Admitting: Family Medicine

## 2021-12-21 ENCOUNTER — Ambulatory Visit: Payer: BC Managed Care – PPO | Admitting: Psychology

## 2021-12-21 ENCOUNTER — Encounter: Payer: Self-pay | Admitting: Cardiology

## 2021-12-21 ENCOUNTER — Other Ambulatory Visit: Payer: Self-pay

## 2021-12-21 DIAGNOSIS — F4323 Adjustment disorder with mixed anxiety and depressed mood: Secondary | ICD-10-CM

## 2021-12-21 MED ORDER — AMLODIPINE BESYLATE 5 MG PO TABS: 10.0000 mg | ORAL_TABLET | Freq: Every day | ORAL | 0 refills | Status: DC

## 2021-12-21 NOTE — Progress Notes (Signed)
[place] Counselor/Therapist Progress Note  Patient ID: Stacy Ayala, MRN: [id number],    Date: 12/21/2021  Time Spent: 30 mins  Treatment Type: Individual Therapy  Reported Symptoms: Pt presented for session, via webex video.  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  I shared with pt that I am in my office with no one else here either.  Mental Status Exam: Appearance:  Casual     Behavior: Appropriate  Motor: Normal  Speech/Language:  Clear and Coherent  Affect: Appropriate  Mood: normal  Thought process: normal  Thought content:   WNL  Sensory/Perceptual disturbances:   WNL  Orientation: oriented to person, place, and time/date  Attention: Good  Concentration: Good  Memory: WNL  Fund of knowledge:  Good  Insight:   Good  Judgment:  Good  Impulse Control: Good   Risk Assessment: Danger to Self:  No Self-injurious Behavior: No Danger to Others: No Duty to Warn:no Physical Aggression / Violence:No  Access to Firearms a concern: No  Gang Involvement:No   Subjective: Pt shares, "I have been good since our last session.  It has been a very busy October.  The visit from my biological dad from [facility] went better than I expected it to go.  I started crying as soon as he came in the door.  We had really good conversations.  My step father came the next weekend and I think he just wanted people to listen to him; he talked the whole time and my husband and I just nodded a lot while he was here.  He dropped his girlfriend off at her friend's house in [place].  He asked if we wanted to meet the girlfriend and I said 'No.'  After my step father left, my husband's youngest brother asked if he could live with [place] for a while, while he is in flight school.  He said he would be with them a month or two."  Pt shares that he is like having another child in our home.  He will be there when Dewaine Conger when he gets home from college for the  Winter break.  They are planning to put time constraints on his brother as to how long he plans to stay with them.  Pt also shares that her older sister (Megan-has always had her set of issues) just broke up with her boyfriend.  Her younger sister Larene Beach) told pt about the break up.  Encouraged pt to continue with her self care activities and we will meet in 4 wks for a follow up session.     Interventions: Cognitive Behavioral Therapy  Diagnosis:Adjustment disorder with mixed anxiety and depressed mood  Plan: Treatment Plan Strengths/Abilities:  Intelligent, Intuitive, Willing to participate in therapy Treatment Preferences:  Outpatient Individual Therapy Statement of Needs:  Patient is to use CBT, mindfulness and coping skills to help manage and/or decrease symptoms associated with their diagnosis. Symptoms:  Depressed/Irritable mood, worry, social withdrawal Problems Addressed:  Depressive thoughts, Sadness, Sleep issues, etc. Long Term Goals:  Pt to reduce overall level, frequency, and intensity of the feelings of depression/anxiety as evidenced by decreased irritability, negative self talk, and helpless feelings from 6 to 7 days/week to 0 to 1 days/week, per client report, for at least 3 consecutive months.  Progress: 10% Short Term Goals:  Pt to verbally express understanding of the relationship between feelings of depression/anxiety and their impact on thinking patterns and behaviors.  Pt to  verbalize an understanding of the role that distorted thinking plays in creating fears, excessive worry, and ruminations.  Progress: 10% Target Date:  09/02/2022 Frequency:  Bi-weekly Modality:  Cognitive Behavioral Therapy Interventions by Therapist:  Therapist will use CBT, Mindfulness exercises, Coping skills and Referrals, as needed by client. Client has verbally approved this treatment plan.  Ivan Anchors, [facility]

## 2021-12-29 ENCOUNTER — Other Ambulatory Visit: Payer: Self-pay | Admitting: Family Medicine

## 2021-12-29 ENCOUNTER — Other Ambulatory Visit: Payer: Self-pay

## 2021-12-29 MED ORDER — AMLODIPINE BESYLATE 10 MG PO TABS: 10.0000 mg | ORAL_TABLET | Freq: Every day | ORAL | 3 refills | Status: DC

## 2022-01-14 ENCOUNTER — Other Ambulatory Visit: Payer: Self-pay | Admitting: Family Medicine

## 2022-01-18 ENCOUNTER — Ambulatory Visit: Payer: BC Managed Care – PPO | Admitting: Psychology

## 2022-02-10 ENCOUNTER — Other Ambulatory Visit: Payer: Self-pay | Admitting: Family Medicine

## 2022-02-23 ENCOUNTER — Other Ambulatory Visit: Payer: Self-pay | Admitting: Family Medicine

## 2022-02-23 NOTE — Telephone Encounter (Signed)
Xanax 0.5 mg LOV: 12/15/21 Last Refill:08/04/21 Upcoming appt: no apt

## 2022-02-23 NOTE — Telephone Encounter (Signed)
Notified pt Rx has been sent in  

## 2022-03-14 ENCOUNTER — Other Ambulatory Visit: Payer: Self-pay | Admitting: Family Medicine

## 2022-04-11 ENCOUNTER — Other Ambulatory Visit: Payer: Self-pay | Admitting: Family Medicine

## 2022-04-16 ENCOUNTER — Other Ambulatory Visit: Payer: Self-pay | Admitting: Family Medicine

## 2022-05-26 ENCOUNTER — Other Ambulatory Visit: Payer: Self-pay | Admitting: Family Medicine

## 2022-06-25 ENCOUNTER — Other Ambulatory Visit: Payer: Self-pay | Admitting: Family Medicine

## 2022-07-09 ENCOUNTER — Other Ambulatory Visit: Payer: Self-pay | Admitting: Family Medicine

## 2022-07-15 ENCOUNTER — Other Ambulatory Visit: Payer: Self-pay | Admitting: Family Medicine

## 2022-07-16 NOTE — Telephone Encounter (Signed)
Pt aware of refill.

## 2022-07-16 NOTE — Telephone Encounter (Signed)
Xanax 0.5 mg LOV: 07/27/21 Last Refill:02/23/22 Upcoming appt: none needs an apt

## 2022-07-30 ENCOUNTER — Other Ambulatory Visit: Payer: Self-pay | Admitting: Family Medicine

## 2022-07-30 MED ORDER — METOPROLOL SUCCINATE ER 25 MG PO TB24: 25.0000 mg | ORAL_TABLET | Freq: Every day | ORAL | 0 refills | Status: DC

## 2022-07-30 NOTE — Telephone Encounter (Signed)
Last refill 06/25/22, 30 day supply. Pt was last seen November, due for 6 month follow up.

## 2022-08-01 ENCOUNTER — Ambulatory Visit: Payer: BC Managed Care – PPO | Admitting: Family Medicine

## 2022-08-01 VITALS — BP 122/80 | HR 87 | Temp 98.0°F | Resp 17 | Ht 65.0 in | Wt 162.5 lb

## 2022-08-01 DIAGNOSIS — E559 Vitamin D deficiency, unspecified: Secondary | ICD-10-CM

## 2022-08-01 DIAGNOSIS — R5383 Other fatigue: Secondary | ICD-10-CM

## 2022-08-01 DIAGNOSIS — I10 Essential (primary) hypertension: Secondary | ICD-10-CM | POA: Diagnosis not present

## 2022-08-01 DIAGNOSIS — E669 Obesity, unspecified: Secondary | ICD-10-CM

## 2022-08-01 DIAGNOSIS — E039 Hypothyroidism, unspecified: Secondary | ICD-10-CM | POA: Diagnosis not present

## 2022-08-01 LAB — VITAMIN D 25 HYDROXY (VIT D DEFICIENCY, FRACTURES): VITD: 64.52 ng/mL (ref 30.00–100.00)

## 2022-08-01 LAB — CBC WITH DIFFERENTIAL/PLATELET
Basophils Absolute: 0 10*3/uL (ref 0.0–0.1)
Basophils Relative: 0.3 % (ref 0.0–3.0)
Eosinophils Absolute: 0.1 10*3/uL (ref 0.0–0.7)
Eosinophils Relative: 1.8 % (ref 0.0–5.0)
HCT: 40.1 % (ref 36.0–46.0)
Hemoglobin: 13.3 g/dL (ref 12.0–15.0)
Lymphocytes Relative: 28.5 % (ref 12.0–46.0)
Lymphs Abs: 1.6 10*3/uL (ref 0.7–4.0)
MCHC: 33.1 g/dL (ref 30.0–36.0)
MCV: 90.2 fl (ref 78.0–100.0)
Monocytes Absolute: 0.4 10*3/uL (ref 0.1–1.0)
Monocytes Relative: 6.3 % (ref 3.0–12.0)
Neutro Abs: 3.5 10*3/uL (ref 1.4–7.7)
Neutrophils Relative %: 63.1 % (ref 43.0–77.0)
Platelets: 261 10*3/uL (ref 150.0–400.0)
RBC: 4.45 Mil/uL (ref 3.87–5.11)
RDW: 12.5 % (ref 11.5–15.5)
WBC: 5.6 10*3/uL (ref 4.0–10.5)

## 2022-08-01 LAB — HEPATIC FUNCTION PANEL
ALT: 12 U/L (ref 0–35)
AST: 16 U/L (ref 0–37)
Albumin: 4.2 g/dL (ref 3.5–5.2)
Alkaline Phosphatase: 44 U/L (ref 39–117)
Bilirubin, Direct: 0.1 mg/dL (ref 0.0–0.3)
Total Bilirubin: 0.4 mg/dL (ref 0.2–1.2)
Total Protein: 7.9 g/dL (ref 6.0–8.3)

## 2022-08-01 LAB — LIPID PANEL
Cholesterol: 137 mg/dL (ref 0–200)
HDL: 36.2 mg/dL — ABNORMAL LOW (ref >39.00)
LDL Cholesterol: 84 mg/dL (ref 0–99)
NonHDL: 101.11
Total CHOL/HDL Ratio: 4
Triglycerides: 87 mg/dL (ref 0.0–149.0)
VLDL: 17.4 mg/dL (ref 0.0–40.0)

## 2022-08-01 LAB — BASIC METABOLIC PANEL
BUN: 10 mg/dL (ref 6–23)
CO2: 25 mEq/L (ref 19–32)
Calcium: 9.5 mg/dL (ref 8.4–10.5)
Chloride: 101 mEq/L (ref 96–112)
Creatinine, Ser: 0.81 mg/dL (ref 0.40–1.20)
GFR: 86.52 mL/min (ref >60.00)
Glucose, Bld: 79 mg/dL (ref 70–99)
Potassium: 4.7 mEq/L (ref 3.5–5.1)
Sodium: 134 mEq/L — ABNORMAL LOW (ref 135–145)

## 2022-08-01 LAB — TSH: TSH: 2.86 u[IU]/mL (ref 0.35–5.50)

## 2022-08-01 MED ORDER — ALPRAZOLAM 0.5 MG PO TABS: 0.5000 mg | ORAL_TABLET | Freq: Three times a day (TID) | ORAL | 0 refills | Status: AC | PRN

## 2022-08-01 MED ORDER — BUPROPION HCL ER (XL) 300 MG PO TB24: 300.0000 mg | ORAL_TABLET | Freq: Every day | ORAL | 1 refills | Status: DC

## 2022-08-01 NOTE — Assessment & Plan Note (Signed)
Given pt's weight loss, she may not need as much BP control.  Given her fatigue and low libido, will stop Metoprolol.  Will monitor BP to ensure it doesn't jump up and will see if sxs improve.  Pt expressed understanding and is in agreement w/ plan.

## 2022-08-01 NOTE — Progress Notes (Signed)
   Subjective:    Patient ID: Stacy Ayala, female    DOB: 1975/11/05, 47 y.o.   MRN: [id number]  HPI Obesity- pt is down 60 lbs since November.  Pt currently on Wegovy 2.4mg  weekly.  Pt reports feeling 'ok'.  + fatigue.  'my sex drive is zero'.  Pt has hx of hypothyroid but has not been on medication recently due to normal TSH levels.  Isn't sure if her medication is causing problems.  Is under stress as son is not doing well in college and failed 2 classes this spring.   Review of Systems For ROS see HPI     Objective:   Physical Exam Vitals reviewed.  Constitutional:      General: She is not in acute distress.    Appearance: Normal appearance. She is well-developed. She is not ill-appearing.  HENT:     Head: Normocephalic and atraumatic.  Eyes:     Conjunctiva/sclera: Conjunctivae normal.     Pupils: Pupils are equal, round, and reactive to light.  Neck:     Thyroid: No thyromegaly.  Cardiovascular:     Rate and Rhythm: Normal rate and regular rhythm.     Pulses: Normal pulses.     Heart sounds: Normal heart sounds. No murmur heard. Pulmonary:     Effort: Pulmonary effort is normal. No respiratory distress.     Breath sounds: Normal breath sounds.  Abdominal:     General: There is no distension.     Palpations: Abdomen is soft.     Tenderness: There is no abdominal tenderness.  Musculoskeletal:     Cervical back: Normal range of motion and neck supple.     Right lower leg: No edema.     Left lower leg: No edema.  Lymphadenopathy:     Cervical: No cervical adenopathy.  Skin:    General: Skin is warm and dry.  Neurological:     General: No focal deficit present.     Mental Status: She is alert and oriented to person, place, and time.  Psychiatric:        Mood and Affect: Mood normal.        Behavior: Behavior normal.        Thought Content: Thought content normal.           Assessment & Plan:  Fatigue- new.  Likely multifactorial- depression, metoprolol,  possible vit deficiency, possible thyroid abnormality.  Hold Metoprolol.  Check labs to assess for underlying cause.  Will follow.

## 2022-08-01 NOTE — Assessment & Plan Note (Signed)
May be contributing to fatigue.  Check labs and replete prn.

## 2022-08-01 NOTE — Patient Instructions (Signed)
Follow up in 4-6 weeks to recheck blood pressure We'll notify you of your lab results and make any changes if needed STOP the Metoprolol and see if symptoms improve Keep up the good work on healthy diet and regular exercise- you look great!!! Call with any questions or concerns Hang in there!!!

## 2022-08-01 NOTE — Assessment & Plan Note (Addendum)
Resolved!  Pt is down 60 lbs since November when she started taking Wegovy.  Now on 2.4mg  weekly.  Surprisingly, she is not feeling as well as she expected.  Struggling w/ fatigue and low libido.  Will check labs to assess for possible underlying cause but discussed that this is likely multifactorial.  Will follow.

## 2022-08-01 NOTE — Assessment & Plan Note (Signed)
Pt has hx of hypothyroid but recent TSH levels have been normal and she has not been on medication.  Check labs and start meds prn.

## 2022-08-02 ENCOUNTER — Telehealth: Payer: Self-pay

## 2022-08-02 ENCOUNTER — Ambulatory Visit: Payer: BC Managed Care – PPO | Admitting: Family Medicine

## 2022-08-02 NOTE — Telephone Encounter (Signed)
Pt seen results via my chart  

## 2022-08-02 NOTE — Telephone Encounter (Signed)
-----   Message from Sheliah Hatch, MD sent at 08/01/2022  6:00 PM EDT ----- Labs look great!  No changes at this time

## 2022-08-29 ENCOUNTER — Ambulatory Visit (INDEPENDENT_AMBULATORY_CARE_PROVIDER_SITE_OTHER): Payer: BC Managed Care – PPO | Admitting: Family Medicine

## 2022-08-29 ENCOUNTER — Encounter: Payer: Self-pay | Admitting: Family Medicine

## 2022-08-29 VITALS — BP 120/72 | HR 82 | Temp 98.1°F | Resp 18 | Ht 65.0 in | Wt 158.4 lb

## 2022-08-29 DIAGNOSIS — I10 Essential (primary) hypertension: Secondary | ICD-10-CM

## 2022-08-29 NOTE — Assessment & Plan Note (Signed)
Remains excellently controlled since stopping Metoprolol.  Fatigue has improved.  No changes at this time

## 2022-08-29 NOTE — Progress Notes (Signed)
   Subjective:    Patient ID: Stacy Ayala, female    DOB: 02-08-1976, 47 y.o.   MRN: [id number]  HPI HTN- chronic problem, on Amlodipine 10mg  daily.  Stopped the Metoprolol at last visit.  BP today remains well controlled.  No CP, SOB, HA's, visual changes, edema.  Fatigue has improved somewhat.     Review of Systems For ROS see HPI     Objective:   Physical Exam Vitals reviewed.  Constitutional:      General: She is not in acute distress.    Appearance: Normal appearance. She is well-developed. She is not ill-appearing.  HENT:     Head: Normocephalic and atraumatic.  Eyes:     Conjunctiva/sclera: Conjunctivae normal.     Pupils: Pupils are equal, round, and reactive to light.  Neck:     Thyroid: No thyromegaly.  Cardiovascular:     Rate and Rhythm: Normal rate and regular rhythm.     Pulses: Normal pulses.     Heart sounds: Normal heart sounds. No murmur heard. Pulmonary:     Effort: Pulmonary effort is normal. No respiratory distress.     Breath sounds: Normal breath sounds.  Abdominal:     General: There is no distension.     Palpations: Abdomen is soft.     Tenderness: There is no abdominal tenderness.  Musculoskeletal:     Cervical back: Normal range of motion and neck supple.     Right lower leg: No edema.     Left lower leg: No edema.  Lymphadenopathy:     Cervical: No cervical adenopathy.  Skin:    General: Skin is warm and dry.  Neurological:     General: No focal deficit present.     Mental Status: She is alert and oriented to person, place, and time.  Psychiatric:        Mood and Affect: Mood normal.        Behavior: Behavior normal.        Thought Content: Thought content normal.           Assessment & Plan:

## 2022-08-29 NOTE — Patient Instructions (Signed)
Schedule your complete physical in November Keep up the good work!  You look great! Continue to hold the Metoprolol Call with any questions or concerns Enjoy the rest of your summer!!!

## 2022-08-31 ENCOUNTER — Other Ambulatory Visit: Payer: Self-pay | Admitting: Family Medicine

## 2022-12-20 ENCOUNTER — Other Ambulatory Visit: Payer: Self-pay | Admitting: Cardiology

## 2023-01-02 ENCOUNTER — Ambulatory Visit: Payer: BC Managed Care – PPO | Admitting: Family Medicine

## 2023-01-03 ENCOUNTER — Ambulatory Visit: Payer: BC Managed Care – PPO | Admitting: Family Medicine

## 2023-01-09 ENCOUNTER — Ambulatory Visit: Payer: BC Managed Care – PPO | Admitting: Family Medicine

## 2023-01-09 ENCOUNTER — Encounter: Payer: Self-pay | Admitting: Family Medicine

## 2023-01-09 VITALS — BP 110/70 | HR 90 | Temp 98.8°F | Ht 65.0 in | Wt 142.1 lb

## 2023-01-09 DIAGNOSIS — I10 Essential (primary) hypertension: Secondary | ICD-10-CM | POA: Diagnosis not present

## 2023-01-09 DIAGNOSIS — Z114 Encounter for screening for human immunodeficiency virus [HIV]: Secondary | ICD-10-CM

## 2023-01-09 DIAGNOSIS — F419 Anxiety disorder, unspecified: Secondary | ICD-10-CM

## 2023-01-09 DIAGNOSIS — Z1159 Encounter for screening for other viral diseases: Secondary | ICD-10-CM

## 2023-01-09 DIAGNOSIS — F32A Depression, unspecified: Secondary | ICD-10-CM

## 2023-01-09 DIAGNOSIS — Z0000 Encounter for general adult medical examination without abnormal findings: Secondary | ICD-10-CM | POA: Diagnosis not present

## 2023-01-09 DIAGNOSIS — E559 Vitamin D deficiency, unspecified: Secondary | ICD-10-CM | POA: Diagnosis not present

## 2023-01-09 LAB — BASIC METABOLIC PANEL
BUN: 15 mg/dL (ref 6–23)
CO2: 29 meq/L (ref 19–32)
Calcium: 9.4 mg/dL (ref 8.4–10.5)
Chloride: 104 meq/L (ref 96–112)
Creatinine, Ser: 0.81 mg/dL (ref 0.40–1.20)
GFR: 86.26 mL/min (ref >60.00)
Glucose, Bld: 83 mg/dL (ref 70–99)
Potassium: 4 meq/L (ref 3.5–5.1)
Sodium: 138 meq/L (ref 135–145)

## 2023-01-09 LAB — HEPATIC FUNCTION PANEL
ALT: 13 U/L (ref 0–35)
AST: 16 U/L (ref 0–37)
Albumin: 4.4 g/dL (ref 3.5–5.2)
Alkaline Phosphatase: 38 U/L — ABNORMAL LOW (ref 39–117)
Bilirubin, Direct: 0.1 mg/dL (ref 0.0–0.3)
Total Bilirubin: 0.3 mg/dL (ref 0.2–1.2)
Total Protein: 7.5 g/dL (ref 6.0–8.3)

## 2023-01-09 LAB — CBC WITH DIFFERENTIAL/PLATELET
Basophils Absolute: 0 10*3/uL (ref 0.0–0.1)
Basophils Relative: 0.3 % (ref 0.0–3.0)
Eosinophils Absolute: 0.1 10*3/uL (ref 0.0–0.7)
Eosinophils Relative: 1.8 % (ref 0.0–5.0)
HCT: 40.2 % (ref 36.0–46.0)
Hemoglobin: 13.4 g/dL (ref 12.0–15.0)
Lymphocytes Relative: 38.6 % (ref 12.0–46.0)
Lymphs Abs: 2 10*3/uL (ref 0.7–4.0)
MCHC: 33.4 g/dL (ref 30.0–36.0)
MCV: 90.6 fL (ref 78.0–100.0)
Monocytes Absolute: 0.4 10*3/uL (ref 0.1–1.0)
Monocytes Relative: 8.3 % (ref 3.0–12.0)
Neutro Abs: 2.6 10*3/uL (ref 1.4–7.7)
Neutrophils Relative %: 51 % (ref 43.0–77.0)
Platelets: 244 10*3/uL (ref 150.0–400.0)
RBC: 4.43 Mil/uL (ref 3.87–5.11)
RDW: 12.1 % (ref 11.5–15.5)
WBC: 5.1 10*3/uL (ref 4.0–10.5)

## 2023-01-09 LAB — POCT URINE DRUG SCREEN
POC Amphetamine UR: NOT DETECTED
POC BENZODIAZEPINES UR: NOT DETECTED
POC Barbiturate UR: NOT DETECTED
POC Cocaine UR: NOT DETECTED
POC Ecstasy UR: NOT DETECTED
POC Marijuana UR: POSITIVE — AB
POC Methadone UR: NOT DETECTED
POC Methamphetamine UR: NOT DETECTED
POC Opiate Ur: NOT DETECTED
POC Oxycodone UR: NOT DETECTED
POC PH URINE: NORMAL
POC PHENCYCLIDINE UR: NOT DETECTED
POC TRICYCLICS UR: NOT DETECTED
URINE TEMPERATURE: 94 [degF] (ref 90.0–100.0)

## 2023-01-09 LAB — LIPID PANEL
Cholesterol: 148 mg/dL (ref 0–200)
HDL: 39.5 mg/dL (ref >39.00)
LDL Cholesterol: 91 mg/dL (ref 0–99)
NonHDL: 108.95
Total CHOL/HDL Ratio: 4
Triglycerides: 91 mg/dL (ref 0.0–149.0)
VLDL: 18.2 mg/dL (ref 0.0–40.0)

## 2023-01-09 LAB — TSH: TSH: 1.67 u[IU]/mL (ref 0.35–5.50)

## 2023-01-09 LAB — VITAMIN D 25 HYDROXY (VIT D DEFICIENCY, FRACTURES): VITD: 73.74 ng/mL (ref 30.00–100.00)

## 2023-01-09 NOTE — Assessment & Plan Note (Signed)
Pt's PE WNL.  UTD on pap, mammo, colonoscopy, Tdap.  Declines flu.  Check labs.  Anticipatory guidance provided.

## 2023-01-09 NOTE — Progress Notes (Signed)
   Subjective:    Patient ID: Stacy Ayala, female    DOB: February 12, 1976, 47 y.o.   MRN: [id number]  HPI CPE- UTD on pap, mammo, colonoscopy, Tdap.  Declines flu  Patient Care Team    Relationship Specialty Notifications Start End  Sheliah Hatch, MD PCP - General   01/25/10     Health Maintenance  Topic Date Due   HIV Screening  Never done   Hepatitis C Screening  Never done   INFLUENZA VACCINE  09/13/2022   MAMMOGRAM  09/26/2022   COVID-19 Vaccine (6 - 2023-24 season) 10/14/2022   DTaP/Tdap/Td (2 - Td or Tdap) 10/24/2023   Cervical Cancer Screening (HPV/Pap Cotest)  09/13/2024   Colonoscopy  09/06/2025   HPV VACCINES  Aged Out      Review of Systems Patient reports no vision/ hearing changes, adenopathy,fever,  persistant/recurrent hoarseness , swallowing issues, chest pain, palpitations, edema, persistant/recurrent cough, hemoptysis, dyspnea (rest/exertional/paroxysmal nocturnal), gastrointestinal bleeding (melena, rectal bleeding), abdominal pain, significant heartburn, bowel changes, GU symptoms (dysuria, hematuria, incontinence), Gyn symptoms (abnormal  bleeding, pain),  syncope, focal weakness, memory loss, numbness & tingling, skin/hair/nail changes, abnormal bruising or bleeding, anxiety, or depression.   + 16 lb weight loss since July    Objective:   Physical Exam General Appearance:    Alert, cooperative, no distress, appears stated age  Head:    Normocephalic, without obvious abnormality, atraumatic  Eyes:    PERRL, conjunctiva/corneas clear, EOM's intact both eyes  Ears:    Normal TM's and external ear canals, both ears  Nose:   Nares normal, septum midline, mucosa normal, no drainage    or sinus tenderness  Throat:   Lips, mucosa, and tongue normal; teeth and gums normal  Neck:   Supple, symmetrical, trachea midline, no adenopathy;    Thyroid: no enlargement/tenderness/nodules  Back:     Symmetric, no curvature, ROM normal, no CVA tenderness  Lungs:     Clear to  auscultation bilaterally, respirations unlabored  Chest Wall:    No tenderness or deformity   Heart:    Regular rate and rhythm, S1 and S2 normal, no murmur, rub   or gallop  Breast Exam:    Deferred to GYN  Abdomen:     Soft, non-tender, bowel sounds active all four quadrants,    no masses, no organomegaly  Genitalia:    Deferred to GYN  Rectal:    Extremities:   Extremities normal, atraumatic, no cyanosis or edema  Pulses:   2+ and symmetric all extremities  Skin:   Skin color, texture, turgor normal, no rashes or lesions  Lymph nodes:   Cervical, supraclavicular, and axillary nodes normal  Neurologic:   CNII-XII intact, normal strength, sensation and reflexes    throughout          Assessment & Plan:

## 2023-01-09 NOTE — Patient Instructions (Signed)
Follow up in 6 months to recheck blood pressure We'll notify you of your lab results and make any changes if needed Keep up the good work on healthy diet and regular exercise- you look great! Call with any questions or concerns Stay Safe!  Stay Healthy! Happy Holidays!!!

## 2023-01-11 LAB — HEPATITIS C ANTIBODY: Hepatitis C Ab: NONREACTIVE

## 2023-01-11 LAB — HIV ANTIBODY (ROUTINE TESTING W REFLEX): HIV 1&2 Ab, 4th Generation: NONREACTIVE

## 2023-01-14 ENCOUNTER — Telehealth: Payer: Self-pay

## 2023-01-14 NOTE — Telephone Encounter (Signed)
 Pt has reviewed Via MyChart

## 2023-01-14 NOTE — Telephone Encounter (Signed)
-----   Message from Neena Rhymes sent at 01/14/2023  1:13 PM EST ----- Labs look great!  No changes at this time

## 2023-01-19 ENCOUNTER — Other Ambulatory Visit: Payer: Self-pay | Admitting: Cardiology

## 2023-03-26 ENCOUNTER — Encounter: Payer: Self-pay | Admitting: Family Medicine

## 2023-03-26 NOTE — Telephone Encounter (Signed)
Patient is asking who she can see for knee pain that started in August and was discussed at November physical, should patient have another visit with you?

## 2023-04-08 ENCOUNTER — Other Ambulatory Visit: Payer: Self-pay | Admitting: Family Medicine

## 2023-05-01 IMAGING — CT CT HEAD W/O CM
3 series · 14 of 46 positions shown, 16 images · non-contrast
Comparison: None.

CLINICAL DATA: Headache



[Series 2: head wo · axial · 0.45mm/px · z∈[+1380,+1500]mm · 8 of 29 slices shown, 10 images]
[im 3/29  brain]
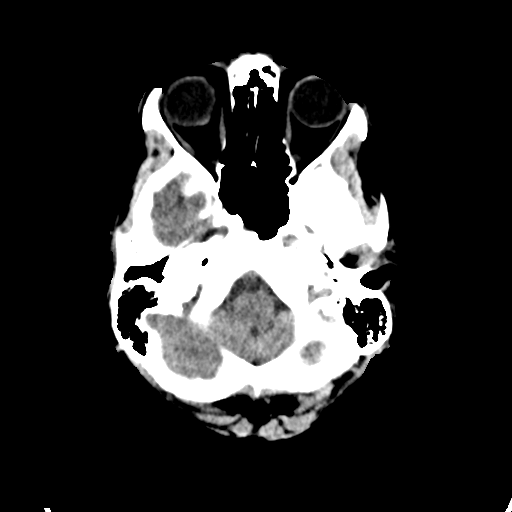
[im 3/29  bone]
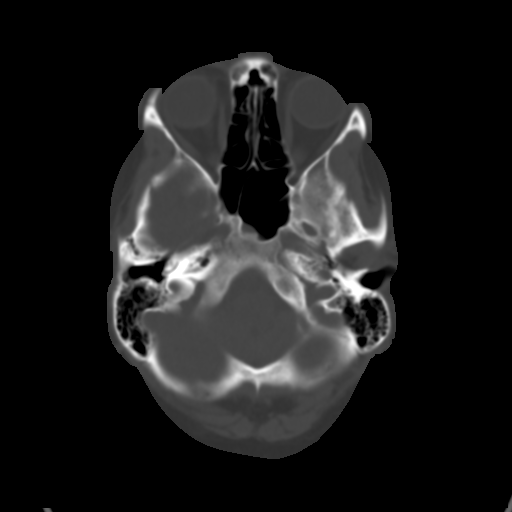
[im 7/29  brain]
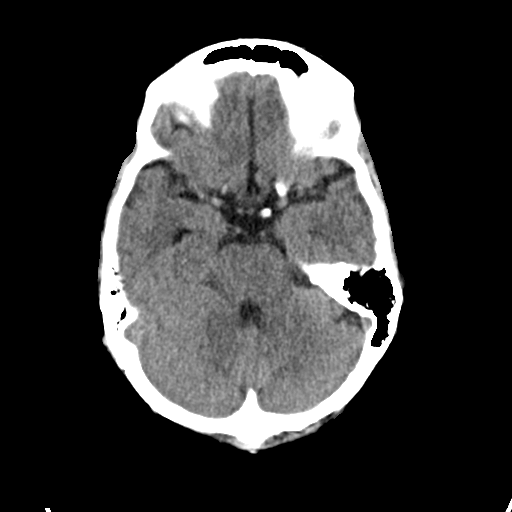
[im 10/29  brain]
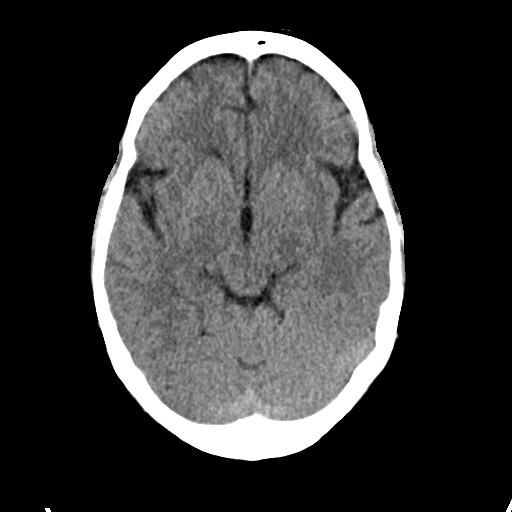
[im 13/29  brain]
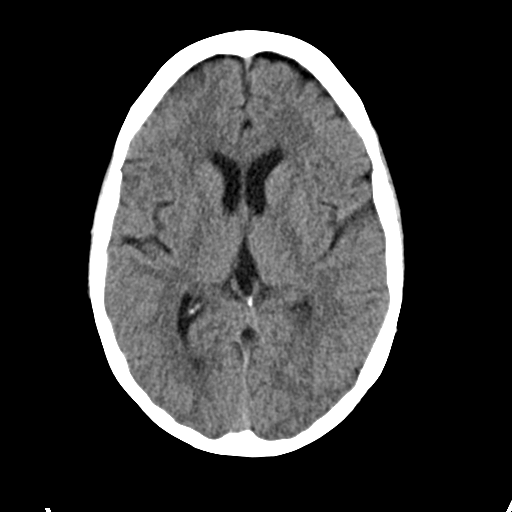
[im 17/29  brain]
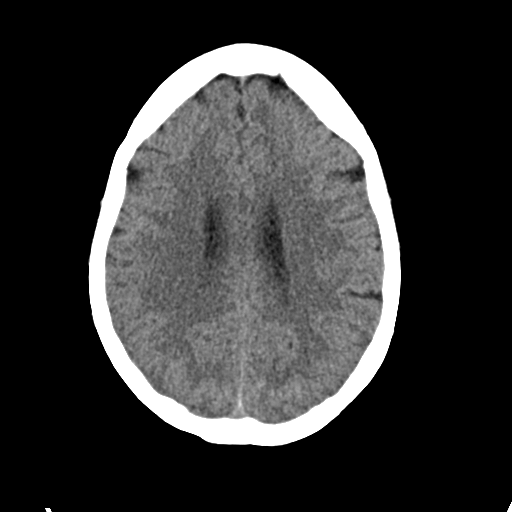
[im 17/29  bone]
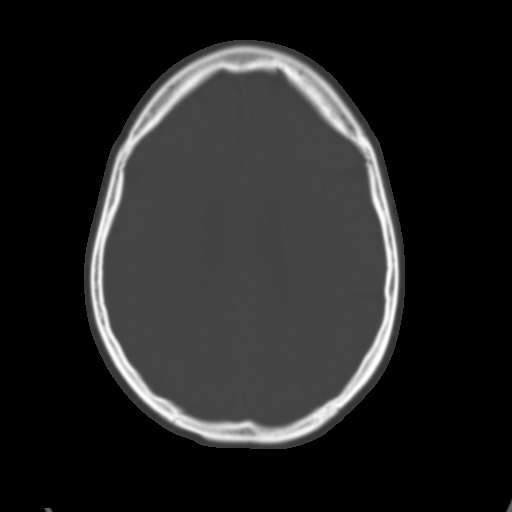
[im 20/29  brain]
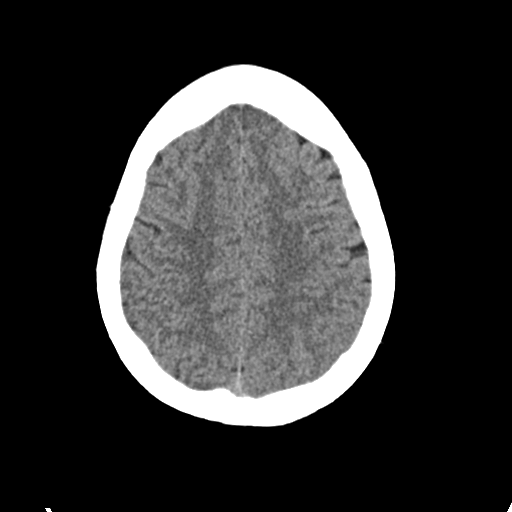
[im 23/29  brain]
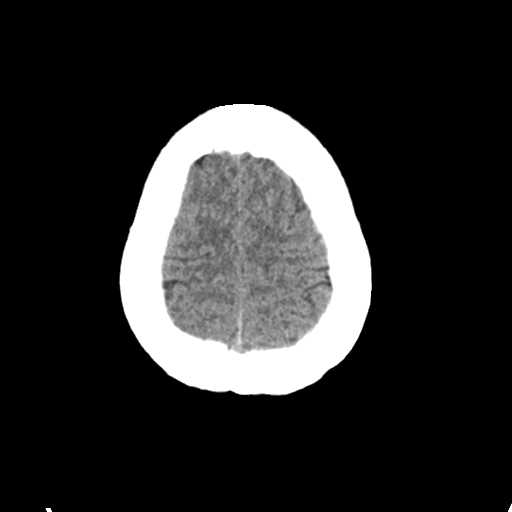
[im 27/29  brain]
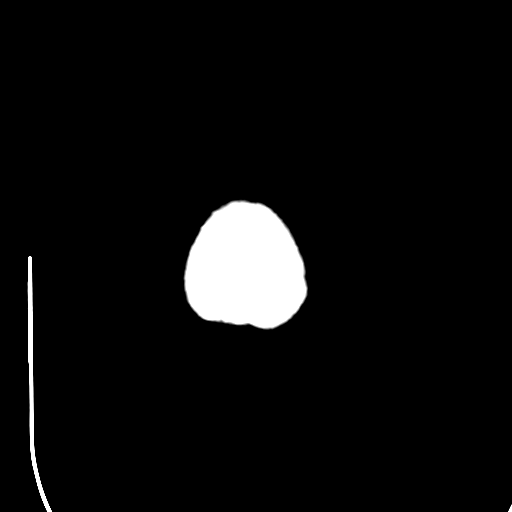

[Series 4: coronal soft · coronal · 0.28mm/px · 3 of 72 slices shown]
[im 24/72  brain]
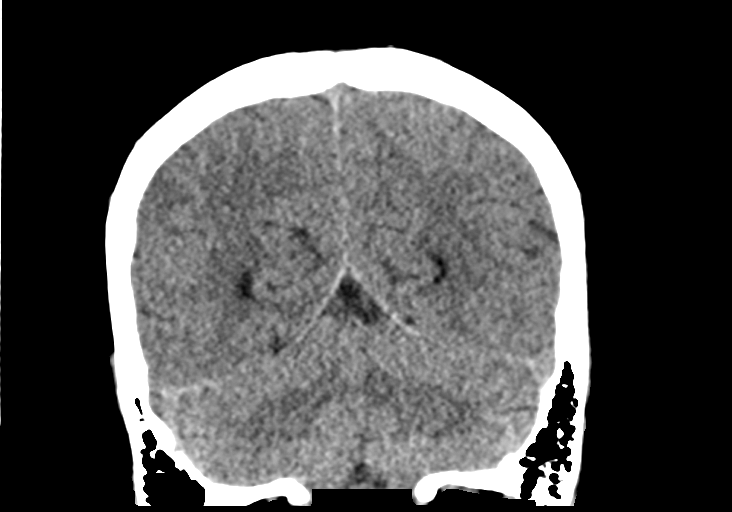
[im 32/72  brain]
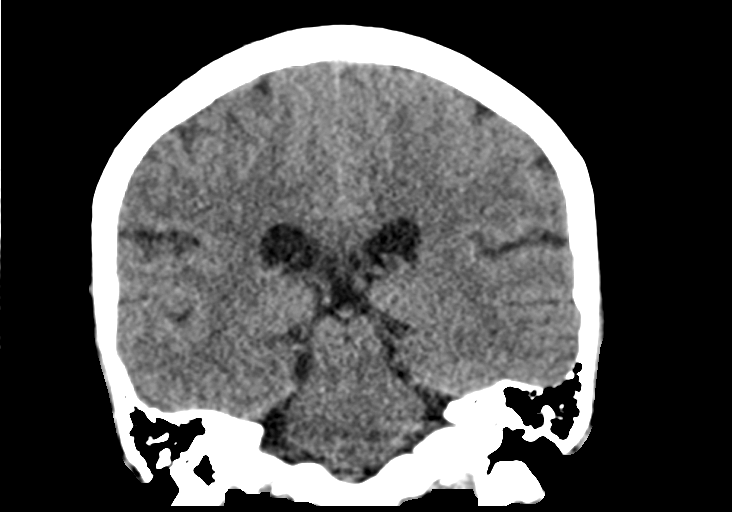
[im 40/72  brain]
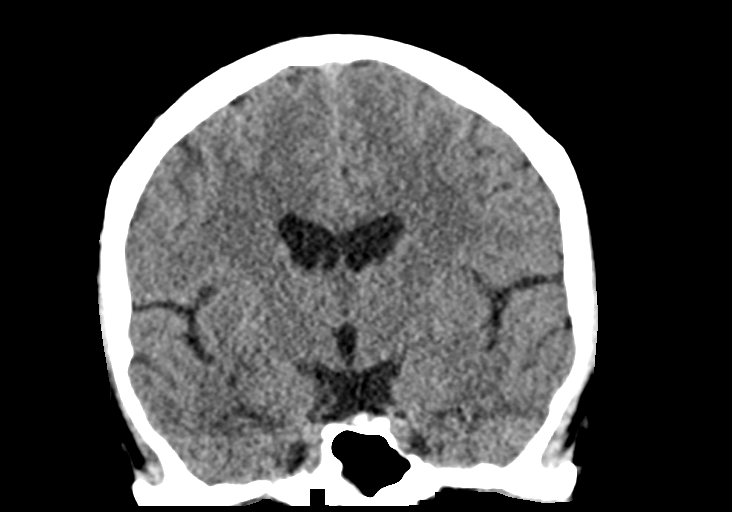

[Series 5: sag soft · sagittal · 0.28mm/px · 3 of 67 slices shown]
[im 23/67  brain]
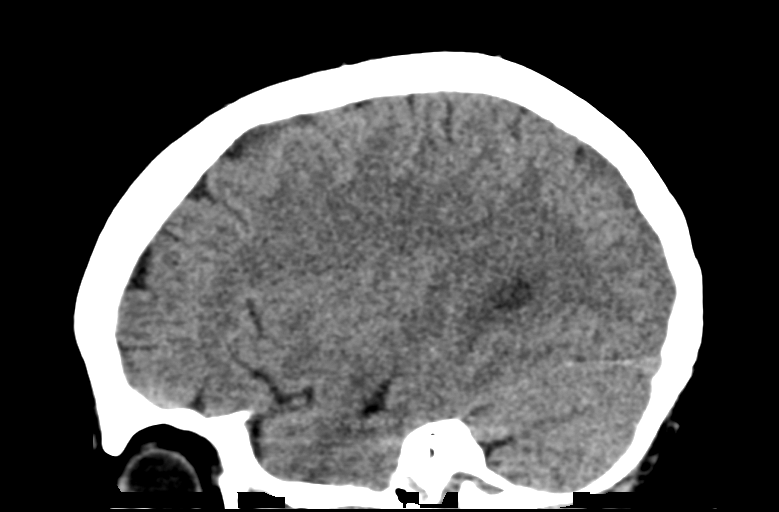
[im 34/67  brain]
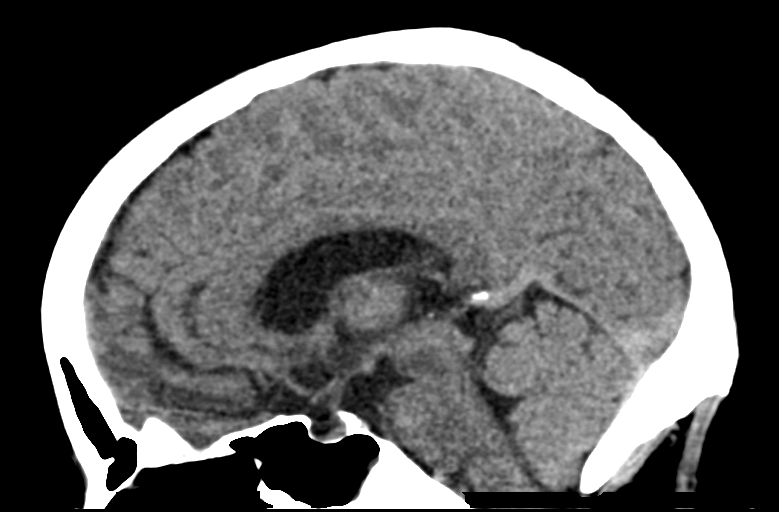
[im 45/67  brain]
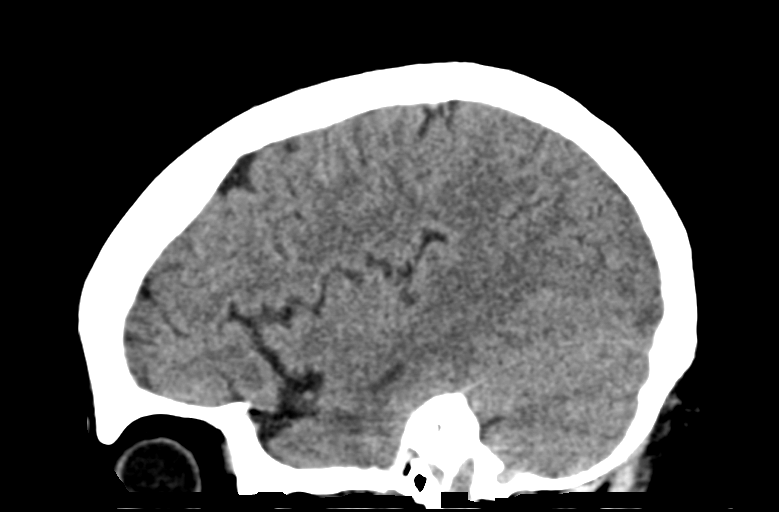

[14 of 46 positions shown; findings below may reference images not displayed]

FINDINGS: Brain: No acute intracranial hemorrhage, mass effect, or herniation.
No extra-axial fluid collections. No evidence of acute territorial
infarct. No hydrocephalus.

Vascular: No hyperdense vessel or unexpected calcification.

Skull: Normal. Negative for fracture or focal lesion.

Sinuses/Orbits: No acute finding.

Other: None.
IMPRESSION: No acute intracranial process identified.

## 2023-05-01 IMAGING — DX DG CHEST 2V
2 series · 2 of 2 positions shown · non-contrast
Comparison: Prior chest radiographs 04/22/2009

CLINICAL DATA: Provided history: Chest pain. Additional history
provided: Headache since [REDACTED].

EXAM:
CHEST - 2 VIEW

[chest pa]
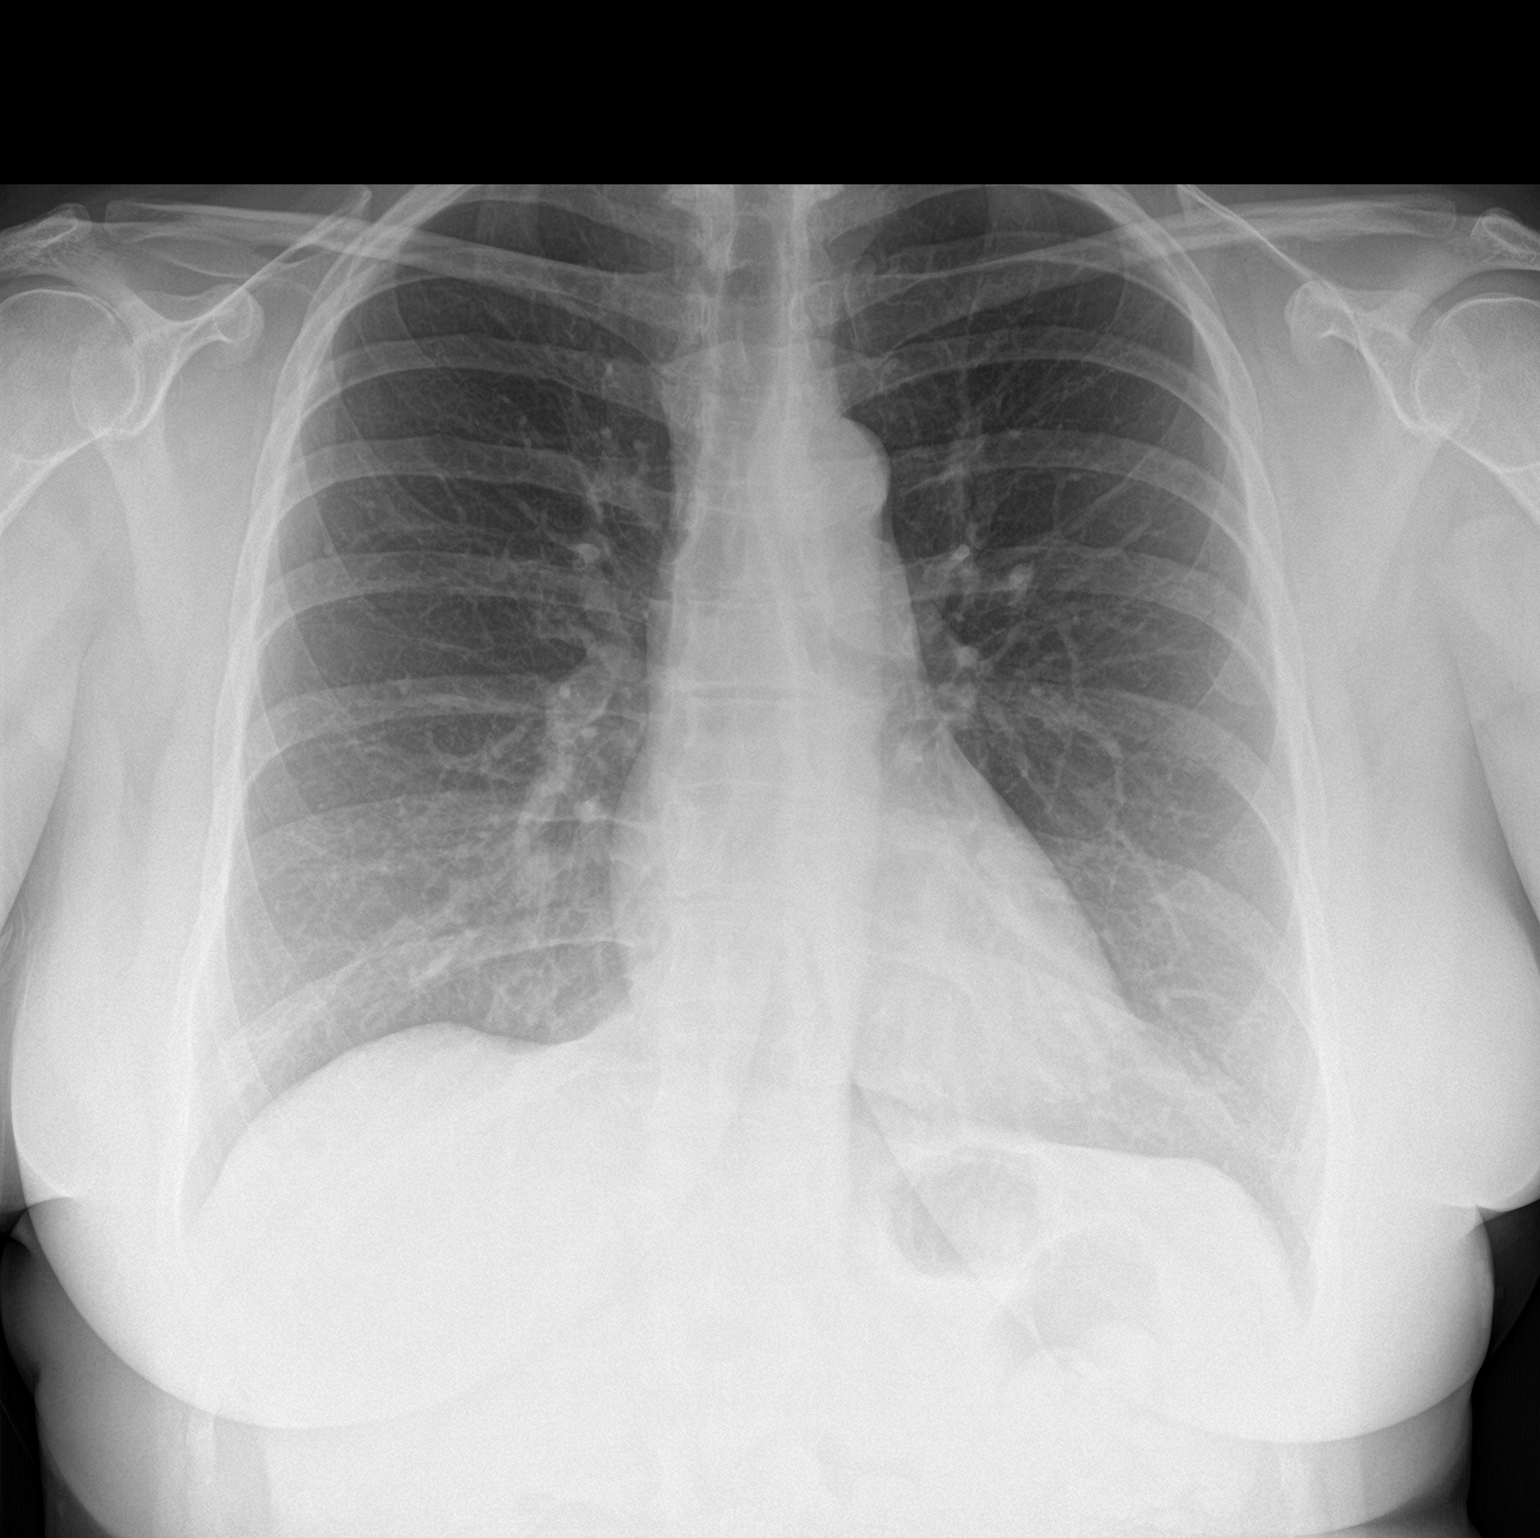

[chest lat]
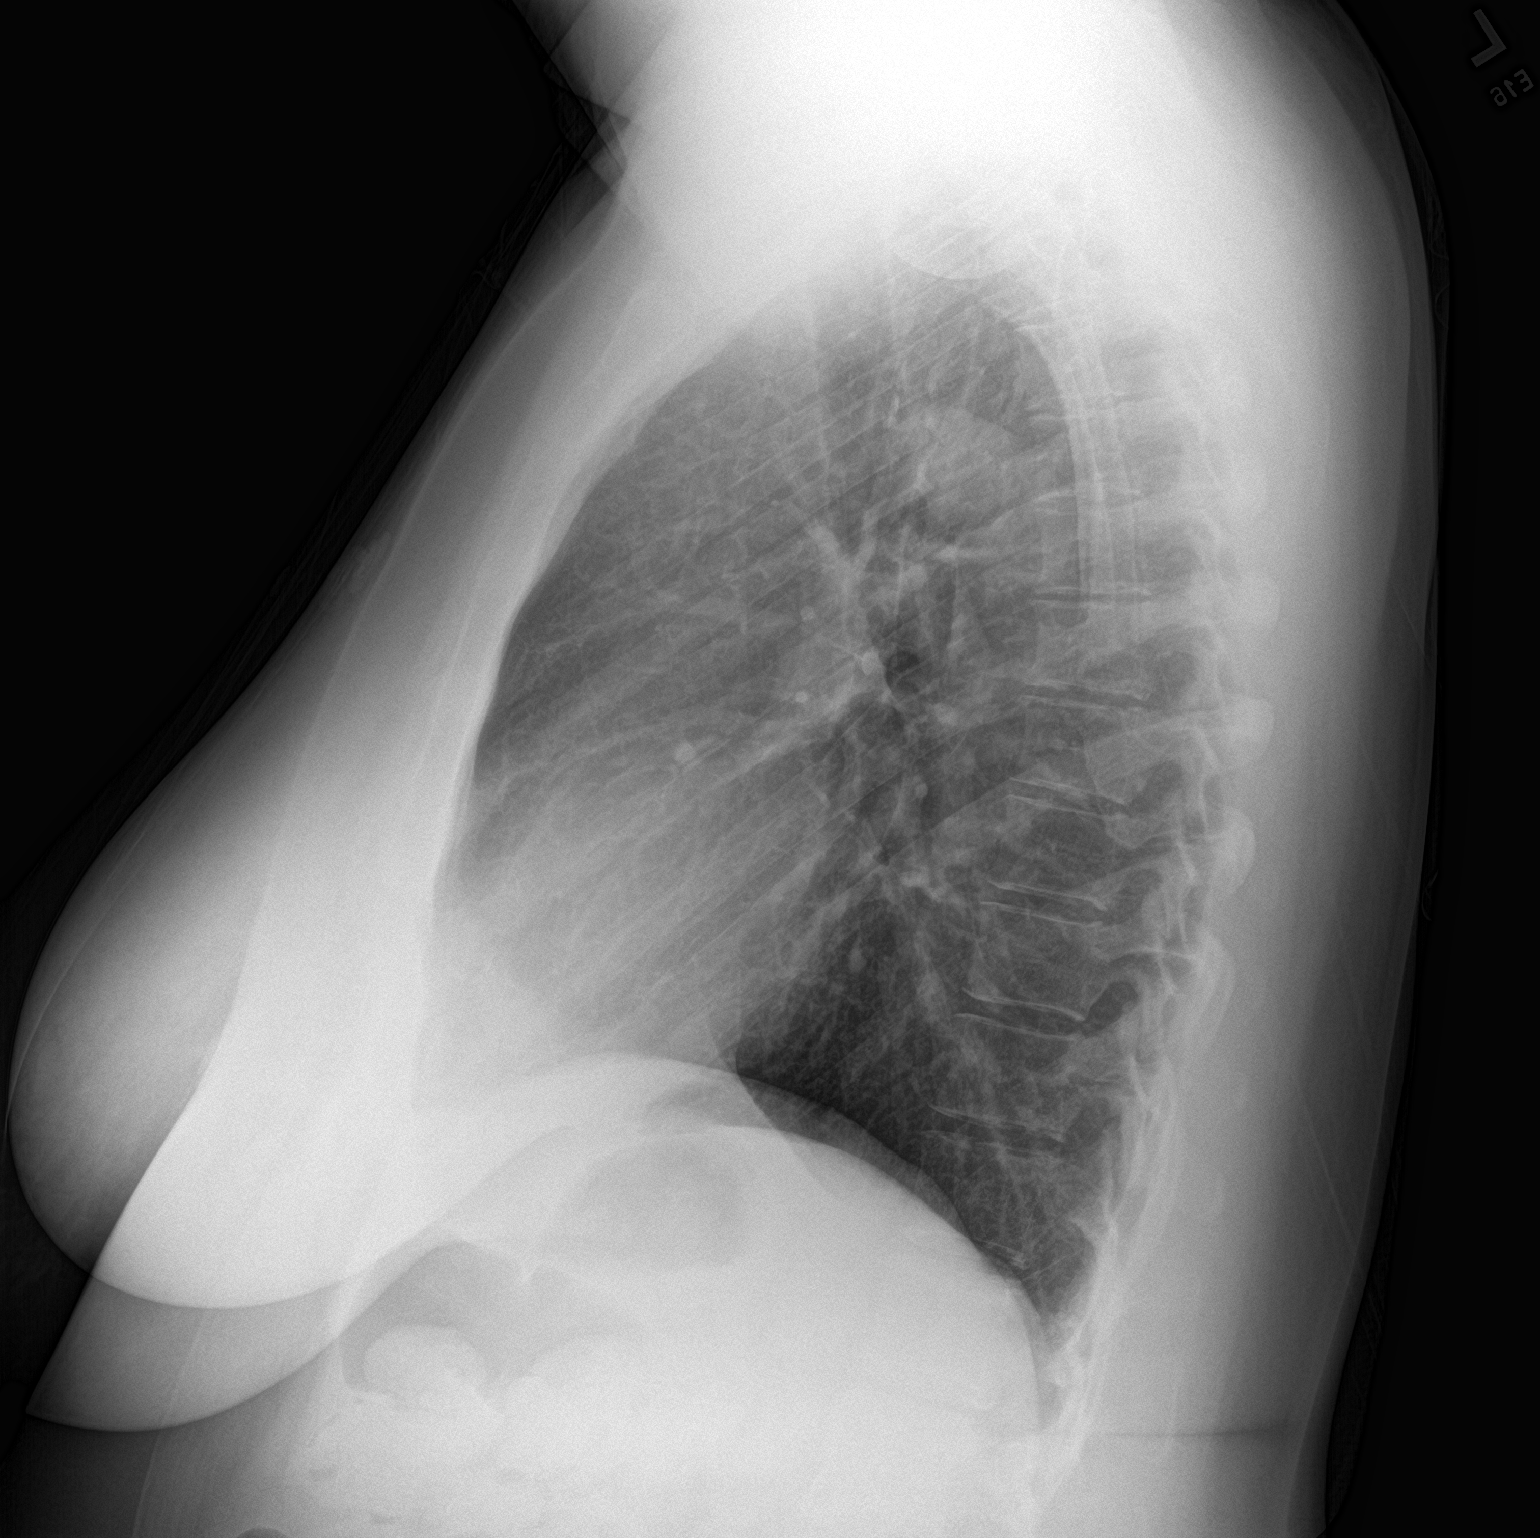

[2 of 2 positions shown; findings below may reference images not displayed]

FINDINGS: Heart size within normal limits. No appreciable airspace
consolidation or pulmonary edema. No evidence of pleural effusion or
pneumothorax. No acute bony abnormality identified.
IMPRESSION: No evidence of active cardiopulmonary disease.

## 2023-05-14 ENCOUNTER — Other Ambulatory Visit: Payer: Self-pay

## 2023-05-16 ENCOUNTER — Encounter: Payer: Self-pay | Admitting: Cardiology

## 2023-05-16 ENCOUNTER — Ambulatory Visit: Attending: Cardiology | Admitting: Cardiology

## 2023-05-16 VITALS — BP 110/78 | HR 96 | Ht 65.0 in | Wt 142.0 lb

## 2023-05-16 DIAGNOSIS — I10 Essential (primary) hypertension: Secondary | ICD-10-CM

## 2023-05-16 MED ORDER — AMLODIPINE BESYLATE 5 MG PO TABS: 5.0000 mg | ORAL_TABLET | Freq: Every day | ORAL | 3 refills | Status: AC

## 2023-05-16 NOTE — Progress Notes (Signed)
 Cardiology Office Note:    Date:  05/16/2023   ID:  Stacy Ayala, DOB [birth year], MRN [id number]  PCP:  Sheliah Hatch, MD  Cardiologist:  Garwin Brothers, MD   Referring MD: Sheliah Hatch, MD    ASSESSMENT:    1. Primary hypertension    PLAN:    In order of problems listed above:  Primary prevention stressed with the patient.  Importance of compliance with diet medication stressed and patient verbalized standing. Essential hypertension: Blood pressure is borderline and she gives occasional symptoms of postural hypotension and therefore I have cut down blood pressure medicine and to half dose.  She will keep a track of her blood pressures and get back to me. She has been taking weight loss medications and has been successful with this.  She has had a good diet and excise program and lipids are fine.  I discussed this with her at length. Patient will be seen in follow-up appointment in 12 months or earlier if the patient has any concerns.    Medication Adjustments/Labs and Tests Ordered: Current medicines are reviewed at length with the patient today.  Concerns regarding medicines are outlined above.  Orders Placed This Encounter  Procedures   EKG 12-Lead   Meds ordered this encounter  Medications   amLODipine (NORVASC) 5 MG tablet    Sig: Take 1 tablet (5 mg total) by mouth daily.    Dispense:  90 tablet    Refill:  3     No chief complaint on file.    History of Present Illness:    Stacy Ayala is a 48 y.o. female.  Patient has past medical history of essential hypertension.  She denies any problems at this time and takes care of activities of daily living.  No chest pain orthopnea or PND.  She is using weight loss medications and has been successful.  She denies any chest pain.  She is walking more than half an hour a day on a daily basis.  At the time of my evaluation, the patient is alert awake oriented and in no distress.  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   Ascorbic Acid (VITAMIN C PO) Take 1 tablet by mouth daily.   Biotin 1 MG CAPS Take 1 mg by mouth daily.   buPROPion (WELLBUTRIN XL) 300 MG 24 hr tablet Take 1 tablet by mouth once daily   magnesium (MAGTAB) 84 MG ( ) TBCR SR tablet Take 84 mg by mouth daily.   Multiple Vitamin (MULTIVITAMIN) tablet Take 1 tablet by mouth daily.     omeprazole (PRILOSEC) 40 MG capsule Take 40 mg by mouth daily.  Semaglutide-Weight Management (WEGOVY) 1 MG/0.5ML SOAJ Inject 1.7 mg into the skin once a week.   Vitamin D, Cholecalciferol, 50 MCG (2000 UT) CAPS Take 2,000 Units by mouth daily.   [DISCONTINUED] amLODipine (NORVASC) 10 MG tablet Take 1 tablet (10 mg total) by mouth daily. Patient Ayala appointment for further refills. 1 st attempt     Allergies:   Bactrim [sulfamethoxazole-trimethoprim]   Social History   Socioeconomic History   Marital status: Married    Spouse name: Not on file   Number of children: Not on file   Years of education: Not on file   Highest education level: Bachelor's degree (e.g., BA, AB, BS)  Occupational History   Not on file  Tobacco Use   Smoking  status: Never   Smokeless tobacco: Never  Vaping Use   Vaping status: Never Used  Substance and Sexual Activity   Alcohol use: Yes    Comment: occ.   Drug use: No   Sexual activity: Not on file  Other Topics Concern   Not on file  Social History Narrative   Teacher at [employer], originally from [place], [place].   Husband went to [employer].         Social Drivers of Corporate investment banker Strain: Low Risk  (01/05/2023)   Overall Financial Resource Strain (CARDIA)    Difficulty of Paying Living Expenses: Not very hard  Food Insecurity: No Food Insecurity (01/05/2023)   Hunger Vital Sign    Worried About Running Out of Food in the Last Year: Never true    Ran Out of Food in the Last Year: Never true  Transportation Ayala: No Transportation Ayala (01/05/2023)   PRAPARE - Administrator, Civil Service (Medical): No    Lack of Transportation (Non-Medical): No  Physical Activity: Sufficiently Active (01/05/2023)   Exercise Vital Sign    Days of Exercise per Week: 5 days    Minutes of Exercise per Session: 30 min  Stress: Stress Concern Present (01/05/2023)   [employer] of Occupational Health - Occupational Stress Questionnaire    Feeling of Stress : Rather much  Social Connections: Moderately Isolated (01/05/2023)   Social Connection and Isolation Panel [NHANES]    Frequency of Communication with Friends and Family: More than three times a week    Frequency of Social Gatherings with Friends and Family: More than three times a week    Attends Religious Services: Never    Database administrator or Organizations: No    Attends Engineer, structural: Not on file    Marital Status: Married     Family History: The patient's family history includes Breast cancer in an other family member; Colon cancer in an other family member; Heart attack in her mother; Hypertension in her mother. There is no history of Colon polyps, Esophageal cancer, Rectal cancer, or  Stomach cancer.  ROS:   Please see the history of present illness.    All other systems reviewed and are negative.  EKGs/Labs/Other Studies Reviewed:    The following studies were reviewed today: .Marland KitchenEKG Interpretation Date/Time:  Thursday May 16 2023 15:14:03 EDT Ventricular Rate:  96 PR Interval:  140 QRS Duration:  82 QT Interval:  332 QTC Calculation: 419 R Axis:   82  Text Interpretation: Normal sinus rhythm Normal ECG When compared with ECG of 10-Apr-2021 15:26, Inverted T waves have replaced nonspecific T wave abnormality in Inferior leads Confirmed by Belva Crome [phone]) on 05/16/2023 3:25:40 PM  Recent Labs: 01/09/2023: ALT 13; BUN 15; Creatinine, Ser 0.81; Hemoglobin 13.4; Platelets 244.0; Potassium 4.0; Sodium 138; TSH 1.67  Recent Lipid Panel    Component Value Date/Time   CHOL 148 01/09/2023 1419   TRIG 91.0 01/09/2023 1419   HDL 39.50 01/09/2023 1419   CHOLHDL 4 01/09/2023 1419   VLDL 18.2 01/09/2023 1419   LDLCALC 91 01/09/2023 1419   LDLCALC 103 (H) 01/07/2017 1631    Physical Exam:    VS:  BP 110/78   Pulse 96   Ht 5\' 5"  (1.651 m)   Wt 142 lb (64.4 kg)   SpO2 99%   BMI 23.63 kg/m     Wt Readings from Last 3 Encounters:  05/16/23 142 lb (64.4 kg)  01/09/23 142 lb 2 oz (64.5 kg)  08/29/22 158 lb 6 oz (71.8 kg)         GEN: Patient is in no acute distress HEENT: Normal NECK: No JVD; No carotid bruits LYMPHATICS: No lymphadenopathy CARDIAC: Hear sounds regular, 2/6 systolic murmur at the apex. RESPIRATORY:  Clear to auscultation without rales, wheezing or rhonchi  ABDOMEN: Soft, non-tender, non-distended MUSCULOSKELETAL:  No edema; No deformity  SKIN: Warm and dry NEUROLOGIC:  Alert and oriented x 3 PSYCHIATRIC:  Normal affect   Signed, Garwin Brothers, MD  05/16/2023 3:52 PM    [facility]

## 2023-05-16 NOTE — Patient Instructions (Signed)
 Medication Instructions:  Your physician has recommended you make the following change in your medication:   START: Amlodipine 5 mg daily  *If you need a refill on your cardiac medications before your next appointment, please call your pharmacy*  Lab Work: None If you have labs (blood work) drawn today and your tests are completely normal, you will receive your results only by: MyChart Message (if you have MyChart) OR A paper copy in the mail If you have any lab test that is abnormal or we need to change your treatment, we will call you to review the results.  Testing/Procedures: None  Follow-Up: At [facility], you and your health needs are our priority.  As part of our continuing mission to provide you with exceptional heart care, our providers are all part of one team.  This team includes your primary Cardiologist (physician) and Advanced Practice Providers or APPs (Physician Assistants and Nurse Practitioners) who all work together to provide you with the care you need, when you need it.  Your next appointment:   1 year(s)  Provider:   Belva Crome, MD    We recommend signing up for the patient portal called "MyChart".  Sign up information is provided on this After Visit Summary.  MyChart is used to connect with patients for Virtual Visits (Telemedicine).  Patients are able to view lab/test results, encounter notes, upcoming appointments, etc.  Non-urgent messages can be sent to your provider as well.   To learn more about what you can do with MyChart, go to [email or web address].   Other Instructions None

## 2023-09-19 LAB — HM MAMMOGRAPHY

## 2023-11-16 ENCOUNTER — Other Ambulatory Visit: Payer: Self-pay | Admitting: Family Medicine

## 2023-11-18 NOTE — Telephone Encounter (Signed)
 Pt needs appt.

## 2023-11-18 NOTE — Telephone Encounter (Signed)
 Patient has appt in December with PCP

## 2024-01-16 ENCOUNTER — Other Ambulatory Visit: Payer: Self-pay | Admitting: Family Medicine

## 2024-02-12 ENCOUNTER — Ambulatory Visit (INDEPENDENT_AMBULATORY_CARE_PROVIDER_SITE_OTHER): Admitting: Family Medicine

## 2024-02-12 ENCOUNTER — Encounter: Payer: Self-pay | Admitting: Family Medicine

## 2024-02-12 VITALS — BP 112/82 | HR 96 | Temp 98.9°F | Ht 65.0 in | Wt 148.0 lb

## 2024-02-12 DIAGNOSIS — Z0000 Encounter for general adult medical examination without abnormal findings: Secondary | ICD-10-CM | POA: Diagnosis not present

## 2024-02-12 DIAGNOSIS — Z23 Encounter for immunization: Secondary | ICD-10-CM

## 2024-02-12 DIAGNOSIS — I10 Essential (primary) hypertension: Secondary | ICD-10-CM | POA: Diagnosis not present

## 2024-02-12 DIAGNOSIS — E559 Vitamin D deficiency, unspecified: Secondary | ICD-10-CM

## 2024-02-12 LAB — CBC WITH DIFFERENTIAL/PLATELET
Basophils Absolute: 0 K/uL (ref 0.0–0.1)
Basophils Relative: 0.3 % (ref 0.0–3.0)
Eosinophils Absolute: 0.1 K/uL (ref 0.0–0.7)
Eosinophils Relative: 1.1 % (ref 0.0–5.0)
HCT: 39.9 % (ref 36.0–46.0)
Hemoglobin: 13.4 g/dL (ref 12.0–15.0)
Lymphocytes Relative: 34.2 % (ref 12.0–46.0)
Lymphs Abs: 1.7 K/uL (ref 0.7–4.0)
MCHC: 33.7 g/dL (ref 30.0–36.0)
MCV: 90.5 fl (ref 78.0–100.0)
Monocytes Absolute: 0.4 K/uL (ref 0.1–1.0)
Monocytes Relative: 7.4 % (ref 3.0–12.0)
Neutro Abs: 2.8 K/uL (ref 1.4–7.7)
Neutrophils Relative %: 57 % (ref 43.0–77.0)
Platelets: 201 K/uL (ref 150.0–400.0)
RBC: 4.41 Mil/uL (ref 3.87–5.11)
RDW: 12.6 % (ref 11.5–15.5)
WBC: 5 K/uL (ref 4.0–10.5)

## 2024-02-12 LAB — BASIC METABOLIC PANEL WITH GFR
BUN: 13 mg/dL (ref 6–23)
CO2: 28 meq/L (ref 19–32)
Calcium: 8.7 mg/dL (ref 8.4–10.5)
Chloride: 104 meq/L (ref 96–112)
Creatinine, Ser: 0.67 mg/dL (ref 0.40–1.20)
GFR: 103.06 mL/min
Glucose, Bld: 83 mg/dL (ref 70–99)
Potassium: 4.3 meq/L (ref 3.5–5.1)
Sodium: 137 meq/L (ref 135–145)

## 2024-02-12 LAB — HEPATIC FUNCTION PANEL
ALT: 16 U/L (ref 3–35)
AST: 18 U/L (ref 5–37)
Albumin: 4.3 g/dL (ref 3.5–5.2)
Alkaline Phosphatase: 35 U/L — ABNORMAL LOW (ref 39–117)
Bilirubin, Direct: 0.1 mg/dL (ref 0.1–0.3)
Total Bilirubin: 0.4 mg/dL (ref 0.2–1.2)
Total Protein: 7.5 g/dL (ref 6.0–8.3)

## 2024-02-12 LAB — LIPID PANEL
Cholesterol: 147 mg/dL (ref 28–200)
HDL: 56 mg/dL
LDL Cholesterol: 75 mg/dL (ref 10–99)
NonHDL: 91
Total CHOL/HDL Ratio: 3
Triglycerides: 79 mg/dL (ref 10.0–149.0)
VLDL: 15.8 mg/dL (ref 0.0–40.0)

## 2024-02-12 LAB — VITAMIN D 25 HYDROXY (VIT D DEFICIENCY, FRACTURES): VITD: 47.98 ng/mL (ref 30.00–100.00)

## 2024-02-12 LAB — TSH: TSH: 2.18 u[IU]/mL (ref 0.35–5.50)

## 2024-02-12 NOTE — Assessment & Plan Note (Signed)
Pt's PE WNL.  UTD on pap, mammo, colonoscopy.  Tdap given today.  Check labs.  Anticipatory guidance provided.  

## 2024-02-12 NOTE — Progress Notes (Signed)
" ° °  Subjective:    Patient ID: Stacy Ayala, female    DOB: 1975/08/11, 48 y.o.   MRN: [id number]  HPI CPE- UTD on pap, colonoscopy, flu.  Requested mammo records.  Due for Tdap  Health Maintenance  Topic Date Due   Mammogram  09/26/2022   DTaP/Tdap/Td (2 - Td or Tdap) 10/24/2023   Hepatitis B Vaccines 19-59 Average Risk (1 of 3 - 19+ 3-dose series) 02/11/2025 (Originally 04/18/1994)   COVID-19 Vaccine (7 - Pfizer risk 2025-26 season) 05/08/2024   Cervical Cancer Screening (HPV/Pap Cotest)  09/13/2024   Colonoscopy  09/06/2025   Influenza Vaccine  Completed   Hepatitis C Screening  Completed   HIV Screening  Completed   Pneumococcal Vaccine  Aged Out   HPV VACCINES  Aged Out   Meningococcal B Vaccine  Aged Out     Patient Care Team    Relationship Specialty Notifications Start End  Mahlon Comer BRAVO, MD PCP - General   01/25/10      Review of Systems Patient reports no vision/ hearing changes, adenopathy,fever, weight change,  persistant/recurrent hoarseness , swallowing issues, chest pain, palpitations, edema, persistant/recurrent cough, hemoptysis, dyspnea (rest/exertional/paroxysmal nocturnal), gastrointestinal bleeding (melena, rectal bleeding), abdominal pain, significant heartburn, bowel changes, GU symptoms (dysuria, hematuria, incontinence), Gyn symptoms (abnormal  bleeding, pain),  syncope, focal weakness, memory loss, numbness & tingling, skin/hair/nail changes, abnormal bruising or bleeding, anxiety, or depression.     Objective:   Physical Exam General Appearance:    Alert, cooperative, no distress, appears stated age  Head:    Normocephalic, without obvious abnormality, atraumatic  Eyes:    PERRL, conjunctiva/corneas clear, EOM's intact both eyes  Ears:    Normal TM's and external ear canals, both ears  Nose:   Nares normal, septum midline, mucosa normal, no drainage    or sinus tenderness  Throat:   Lips, mucosa, and tongue normal; teeth and gums normal  Neck:    Supple, symmetrical, trachea midline, no adenopathy;    Thyroid : no enlargement/tenderness/nodules  Back:     Symmetric, no curvature, ROM normal, no CVA tenderness  Lungs:     Clear to auscultation bilaterally, respirations unlabored  Chest Wall:    No tenderness or deformity   Heart:    Regular rate and rhythm, S1 and S2 normal, no murmur, rub   or gallop  Breast Exam:    Deferred to GYN  Abdomen:     Soft, non-tender, bowel sounds active all four quadrants,    no masses, no organomegaly  Genitalia:    Deferred to GYN  Rectal:    Extremities:   Extremities normal, atraumatic, no cyanosis or edema  Pulses:   2+ and symmetric all extremities  Skin:   Skin color, texture, turgor normal, no rashes or lesions  Lymph nodes:   Cervical, supraclavicular, and axillary nodes normal  Neurologic:   CNII-XII intact, normal strength, sensation and reflexes    throughout          Assessment & Plan:    "

## 2024-02-12 NOTE — Patient Instructions (Signed)
 Follow up in 1 year or as needed We'll notify you of your lab results and make any changes if needed Continue to work on healthy diet and regular exercise- you can do it! Call with any questions or concerns Stay Safe!  Stay Healthy! Happy New Year!!

## 2024-02-17 ENCOUNTER — Ambulatory Visit: Payer: Self-pay | Admitting: Family Medicine

## 2024-03-18 ENCOUNTER — Other Ambulatory Visit: Payer: Self-pay | Admitting: Family Medicine
